# Patient Record
Sex: Male | Born: 1950 | Race: White | Hispanic: No | Marital: Married | Smoking: Never smoker
Health system: Southern US, Community
[De-identification: ages and names within clinical notes are randomized; demographics above are authoritative.]

## PROBLEM LIST (undated history)

## (undated) DIAGNOSIS — E785 Hyperlipidemia, unspecified: Secondary | ICD-10-CM

## (undated) DIAGNOSIS — K219 Gastro-esophageal reflux disease without esophagitis: Secondary | ICD-10-CM

## (undated) DIAGNOSIS — I10 Essential (primary) hypertension: Secondary | ICD-10-CM

## (undated) DIAGNOSIS — H269 Unspecified cataract: Secondary | ICD-10-CM

## (undated) DIAGNOSIS — R600 Localized edema: Secondary | ICD-10-CM

## (undated) DIAGNOSIS — M1990 Unspecified osteoarthritis, unspecified site: Secondary | ICD-10-CM

## (undated) DIAGNOSIS — G2581 Restless legs syndrome: Secondary | ICD-10-CM

## (undated) DIAGNOSIS — T7840XA Allergy, unspecified, initial encounter: Secondary | ICD-10-CM

## (undated) DIAGNOSIS — G4700 Insomnia, unspecified: Secondary | ICD-10-CM

## (undated) DIAGNOSIS — G4730 Sleep apnea, unspecified: Secondary | ICD-10-CM

## (undated) DIAGNOSIS — Z5189 Encounter for other specified aftercare: Secondary | ICD-10-CM

## (undated) HISTORY — PX: EXPLORATORY LAPAROTOMY: SUR591

## (undated) HISTORY — DX: Unspecified osteoarthritis, unspecified site: M19.90

## (undated) HISTORY — DX: Gastro-esophageal reflux disease without esophagitis: K21.9

## (undated) HISTORY — DX: Restless legs syndrome: G25.81

## (undated) HISTORY — PX: APPENDECTOMY: SHX54

## (undated) HISTORY — DX: Hyperlipidemia, unspecified: E78.5

## (undated) HISTORY — DX: Unspecified cataract: H26.9

## (undated) HISTORY — DX: Insomnia, unspecified: G47.00

## (undated) HISTORY — DX: Localized edema: R60.0

## (undated) HISTORY — DX: Essential (primary) hypertension: I10

## (undated) HISTORY — DX: Encounter for other specified aftercare: Z51.89

## (undated) HISTORY — DX: Allergy, unspecified, initial encounter: T78.40XA

---

## 1991-10-11 HISTORY — PX: BACK SURGERY: SHX140

## 1996-10-10 HISTORY — PX: KNEE ARTHROSCOPY: SHX127

## 2001-03-09 ENCOUNTER — Inpatient Hospital Stay (HOSPITAL_COMMUNITY): Admission: EM | Admit: 2001-03-09 | Discharge: 2001-03-13 | Payer: Self-pay | Admitting: *Deleted

## 2001-03-09 ENCOUNTER — Encounter: Payer: Self-pay | Admitting: Internal Medicine

## 2001-03-10 ENCOUNTER — Encounter: Payer: Self-pay | Admitting: General Surgery

## 2001-03-12 ENCOUNTER — Encounter: Payer: Self-pay | Admitting: General Surgery

## 2001-07-11 ENCOUNTER — Encounter: Payer: Self-pay | Admitting: Orthopedic Surgery

## 2001-07-18 ENCOUNTER — Observation Stay (HOSPITAL_COMMUNITY): Admission: RE | Admit: 2001-07-18 | Discharge: 2001-07-19 | Payer: Self-pay | Admitting: Orthopedic Surgery

## 2001-07-18 HISTORY — PX: OTHER SURGICAL HISTORY: SHX169

## 2004-10-26 ENCOUNTER — Ambulatory Visit: Payer: Self-pay | Admitting: Family Medicine

## 2004-11-10 ENCOUNTER — Ambulatory Visit: Payer: Self-pay | Admitting: Family Medicine

## 2004-11-18 ENCOUNTER — Ambulatory Visit: Payer: Self-pay | Admitting: Family Medicine

## 2004-11-29 ENCOUNTER — Ambulatory Visit: Payer: Self-pay | Admitting: Family Medicine

## 2004-12-15 ENCOUNTER — Ambulatory Visit: Payer: Self-pay

## 2005-03-22 ENCOUNTER — Ambulatory Visit: Payer: Self-pay | Admitting: Family Medicine

## 2005-06-30 ENCOUNTER — Ambulatory Visit: Payer: Self-pay | Admitting: Family Medicine

## 2005-10-04 ENCOUNTER — Ambulatory Visit: Payer: Self-pay | Admitting: Family Medicine

## 2005-10-12 ENCOUNTER — Ambulatory Visit: Payer: Self-pay | Admitting: Family Medicine

## 2008-03-10 HISTORY — PX: NM MYOVIEW LTD: HXRAD82

## 2008-03-17 ENCOUNTER — Ambulatory Visit: Payer: Self-pay | Admitting: Internal Medicine

## 2008-03-25 ENCOUNTER — Ambulatory Visit: Payer: Self-pay

## 2008-03-25 LAB — CONVERTED CEMR LAB
BUN: 17 mg/dL (ref 6–23)
CO2: 30 meq/L (ref 19–32)
Calcium: 9.9 mg/dL (ref 8.4–10.5)
Chloride: 103 meq/L (ref 96–112)
Creatinine, Ser: 1.1 mg/dL (ref 0.4–1.5)
GFR calc Af Amer: 88 mL/min
GFR calc non Af Amer: 73 mL/min
Glucose, Bld: 116 mg/dL — ABNORMAL HIGH (ref 70–99)
Potassium: 3.9 meq/L (ref 3.5–5.1)
Sodium: 142 meq/L (ref 135–145)

## 2008-04-01 ENCOUNTER — Ambulatory Visit: Payer: Self-pay | Admitting: Internal Medicine

## 2008-06-10 ENCOUNTER — Ambulatory Visit: Payer: Self-pay | Admitting: Internal Medicine

## 2008-09-23 ENCOUNTER — Ambulatory Visit: Payer: Self-pay | Admitting: Internal Medicine

## 2009-07-09 ENCOUNTER — Ambulatory Visit: Payer: Self-pay | Admitting: Internal Medicine

## 2009-07-09 DIAGNOSIS — I10 Essential (primary) hypertension: Secondary | ICD-10-CM | POA: Insufficient documentation

## 2009-07-09 DIAGNOSIS — R609 Edema, unspecified: Secondary | ICD-10-CM

## 2009-07-09 DIAGNOSIS — R5383 Other fatigue: Secondary | ICD-10-CM

## 2009-07-09 DIAGNOSIS — R5381 Other malaise: Secondary | ICD-10-CM

## 2009-11-05 ENCOUNTER — Telehealth: Payer: Self-pay | Admitting: Internal Medicine

## 2010-08-30 ENCOUNTER — Ambulatory Visit: Payer: Self-pay | Admitting: Internal Medicine

## 2010-09-06 ENCOUNTER — Telehealth: Payer: Self-pay | Admitting: Internal Medicine

## 2010-09-07 ENCOUNTER — Telehealth (INDEPENDENT_AMBULATORY_CARE_PROVIDER_SITE_OTHER): Payer: Self-pay | Admitting: *Deleted

## 2010-09-09 ENCOUNTER — Encounter: Payer: Self-pay | Admitting: Internal Medicine

## 2010-09-10 ENCOUNTER — Ambulatory Visit: Payer: Self-pay | Admitting: Internal Medicine

## 2010-09-21 ENCOUNTER — Ambulatory Visit: Payer: Self-pay | Admitting: Internal Medicine

## 2010-10-06 ENCOUNTER — Ambulatory Visit: Payer: Self-pay | Admitting: Internal Medicine

## 2010-10-10 HISTORY — PX: HERNIA REPAIR: SHX51

## 2010-10-15 ENCOUNTER — Ambulatory Visit: Admit: 2010-10-15 | Payer: Self-pay | Admitting: Internal Medicine

## 2010-10-28 ENCOUNTER — Telehealth: Payer: Self-pay | Admitting: Internal Medicine

## 2010-11-07 LAB — CONVERTED CEMR LAB
BUN: 24 mg/dL — ABNORMAL HIGH (ref 6–23)
CO2: 32 meq/L (ref 19–32)
Calcium: 8.7 mg/dL (ref 8.4–10.5)
Chloride: 103 meq/L (ref 96–112)
Creatinine, Ser: 1.5 mg/dL (ref 0.4–1.5)
GFR calc non Af Amer: 49.83 mL/min (ref >60)
Glucose, Bld: 96 mg/dL (ref 70–99)
Potassium: 3.7 meq/L (ref 3.5–5.1)
Sodium: 142 meq/L (ref 135–145)
TSH: 0.23 microintl units/mL — ABNORMAL LOW (ref 0.35–5.50)

## 2010-11-09 NOTE — Progress Notes (Signed)
Summary: Records Request  Faxed EKG to [facility] ([phone]) upon receiving ROI from patient. Debby Freiberg  September 07, 2010 3:29 PM   Appended Document: Records Request Faxed EKG again to [facility] ([phone]). {[email or web address]}  {DATETIMESTAMP()}

## 2010-11-09 NOTE — Progress Notes (Signed)
Summary: side effect from meds  NM  Medications Added AMLODIPINE BESYLATE 5 MG TABS (AMLODIPINE BESYLATE) Take one tablet by mouth daily       Phone Note Call from Patient Call back at Home Phone [phone] Call back at Work Phone [phone]   Caller: Patient Reason for Call: Talk to Nurse Summary of Call: c/o b/p 183/94 . tired all the time.  meds dosage was increase - TEKTURNA 300 MG TABS Take 1 tablet by mouth once a day Initial call taken by: Lorne Skeens,  September 06, 2010 10:54 AM  Follow-up for Phone Call        Pt. states on the last office visit 11/21/ 11 Dr. Gala Romney increased the dose of Tekturna from 150 mg to 300 mg once a day. Since then pt c/o of being tired, fatigue all the time. His B/P has been extremely high. Todays B/P is  178/99 to 183/94. Follow-up by: Ollen Gross, RN, BSN,  September 06, 2010 11:53 AM  Additional Follow-up for Phone Call Additional follow up Details #1::        Dr Riley Kill DOD recommends for pt. to rest today, and to see Dr. Gala Romney tomorrow. An appointment was made for pt. for tomorrow 09/07/10 at 11:00 Am. I let a phone message to pt. to call back. Ollen Gross, RN, BSN  September 06, 2010 12:25  PM Pt. called back. He states can't make the appointment for tomorrow because he has other engagement. The appointment was changed to 09/10/10 at 11:15 AM. Pt aware.  Additional Follow-up by: Ollen Gross, RN, BSN,  September 06, 2010 4:35 PM    Additional Follow-up for Phone Call Additional follow up Details #2::    Add amlodipine 5mg  daily.  Creatinine up slightly on last labs and TSH low. Please schedule f/u labs for this week including repeat BMET, TSH, free T4, Free T3, cortisol level and UA. Dolores Patty, MD, [facility]  September 07, 2010 12:58 PM  Left message to call back Meredith Staggers, RN  September 07, 2010 5:27 PM   spoke w/pt he is aware, rx sent into pharmacy, order for labs faxed to his pcp in [facility], RN   September 08, 2010 9:51 AM   New/Updated Medications: AMLODIPINE BESYLATE 5 MG TABS (AMLODIPINE BESYLATE) Take one tablet by mouth daily Prescriptions: AMLODIPINE BESYLATE 5 MG TABS (AMLODIPINE BESYLATE) Take one tablet by mouth daily  #30 x 6   Entered by:   Meredith Staggers, RN   Authorized by:   Dolores Patty, MD, [facility]   Signed by:   Meredith Staggers, RN on 09/08/2010   Method used:   [facility]        [facility] [id number] (retail)       [address]       [place], [place]  [address]       Ph: [phone] or [phone]       Fax: [phone]   RxID:   [id number]

## 2010-11-09 NOTE — Progress Notes (Signed)
Summary: REFILL   Phone Note Refill Request Message from:  Patient on November 05, 2009 3:43 PM  Refills Requested: Medication #1:  AMBIEN 10 MG TABS Take 1 tab by mouth at bedtime as needed. [facility] [address] [facility] [place] [phone]  Initial call taken by: Judie Grieve,  November 05, 2009 3:46 PM  Follow-up for Phone Call        left message  Hardin Negus, RMA  November 06, 2009 9:51 AM  Spoke with Pt told him to obtain refills from PCP. He understands and agrees to do this. Follow-up by: Hardin Negus, RMA,  November 06, 2009 2:34 PM

## 2010-11-09 NOTE — Assessment & Plan Note (Signed)
Summary: rov./gd  Medications Added TEKTURNA 300 MG TABS (ALISKIREN FUMARATE) Take 1 tablet by mouth once a day ASPIRIN 81 MG TBEC (ASPIRIN) Take one tablet by mouth daily PREVACID 30 MG CPDR (LANSOPRAZOLE) Take 1 tablet by mouth once a day FLEXERIL 5 MG TABS (CYCLOBENZAPRINE HCL) as needed      Allergies Added: NKDA  Visit Type:  Follow-up Primary Provider:  Dina Rich Md  CC:  no complaints.  History of Present Illness: Kristopher [name] is a delightful 60 year old male who works with [employer].  He has a history of hypertension, lower extremity edema, hyperlipidemia, and dyspnea.  He returns today for routine followup.   He had a Myoview back in June 2009, which showed an EF of 58% with no ischemia.  Overall, he is doing great.  He is remaining active with no chest pain or shortness of breath.    Taking BPs regularly. Recently SBPs have been elevated with avearge 135-155 occasional spike to 175. No symptoms from it. Compliant with meds and trying to watch salt.   Had sleep study at [place] a few years ago. Was told he had restless legs. He's not sure if he snores or not.   Current Medications (verified): 1)  Lipitor 40 Mg Tabs (Atorvastatin Calcium) .... Take One Tablet By Mouth Daily. 2)  Lisinopril-Hydrochlorothiazide 20-12.5 Mg Tabs (Lisinopril-Hydrochlorothiazide) .... Take One Tablet By Mouth Twice Daily. 3)  Carvedilol 25 Mg Tabs (Carvedilol) .... Take One Tablet By Mouth Twice A Day 4)  Tekturna 150 Mg Tabs (Aliskiren Fumarate) .... Take One Tablet By Mouth Daily 5)  Ropinirole Hcl 1 Mg Tabs (Ropinirole Hcl) .... Take One Tablet By Mouth Once Daily. 6)  Fish Oil   Oil (Fish Oil) .... 1200mg  Two Times A Day 7)  Tramadol-Acetaminophen 37.5-325 Mg Tabs (Tramadol-Acetaminophen) .... As Needed 8)  Aspirin 81 Mg Tbec (Aspirin) .... Take One Tablet By Mouth Daily 9)  Ambien 10 Mg Tabs (Zolpidem Tartrate) .... Take 1 Tab By Mouth At Bedtime As Needed 10)  Prevacid 30 Mg Cpdr  (Lansoprazole) .... Take 1 Tablet By Mouth Once A Day 11)  Flexeril 5 Mg Tabs (Cyclobenzaprine Hcl) .... As Needed  Allergies (verified): No Known Drug Allergies  Vital Signs:  Patient profile:   60 year old male Height:      73 inches Weight:      223 pounds BMI:     29.53 Pulse rate:   62 / minute BP sitting:   152 / 86  (left arm) Cuff size:   regular  Vitals Entered By: Hardin Negus, RMA (August 30, 2010 3:10 PM)  Physical Exam  General:  Well appearing. no resp difficulty HEENT: normal Neck: supple. no JVD. Carotids 2+ bilat; no bruits. No lymphadenopathy or thryomegaly appreciated. Cor: PMI nondisplaced. Regular rate & rhythm. No rubs, murmur. +s4 Lungs: clear Abdomen: soft, nontender, nondistended. No hepatosplenomegaly. No bruits or masses. Good bowel sounds. Extremities: no cyanosis, clubbing, rash, edema Neuro: alert & orientedx3, cranial nerves grossly intact. moves all 4 extremities w/o difficulty. affect pleasant    Impression & Recommendations:  Problem # 1:  HYPERTENSION, BENIGN (ICD-401.1) BP elevated. Will increase Tekturna to 300qd. May need another agent soon as well. Check BMET soon.   Other Orders: EKG w/ Interpretation (93000) TLB-BMP (Basic Metabolic Panel-BMET) (80048-METABOL) TLB-TSH (Thyroid Stimulating Hormone) (16109-UEA)  Patient Instructions: 1)  Labs today 2)  Increase Tekturna to 300mg  daily 3)  Your physician recommends that you return for lab work in: 2 weeks  at your primary care doctors office 4)  Follow up in 6 weeks Prescriptions: TEKTURNA 300 MG TABS (ALISKIREN FUMARATE) Take 1 tablet by mouth once a day  #30 x 6   Entered by:   Meredith Staggers, RN   Authorized by:   Dolores Patty, MD, [facility]   Signed by:   Meredith Staggers, RN on 08/30/2010   Method used:   [facility]        [facility]  [id number] (retail)       [address]       [place], [place]  [address]       Ph: [phone] or [phone]       Fax:  [phone]   RxID:   [id number]

## 2010-11-09 NOTE — Assessment & Plan Note (Signed)
Summary: high B/P 183/94/ tired fatigue/nm   Visit Type:  Follow-up Primary Labron Bloodgood:  Dina Rich Md   History of Present Illness: Kristopher [name] is a delightful 60 year old male who works with [employer].  He has a history of hypertension, lower extremity edema, hyperlipidemia, and dyspnea.  He returns today for routine followup.   He had a Myoview back in June 2009, which showed an EF of 58% with no ischemia.  Overall, he is doing great.  He is remaining active with no chest pain or shortness of breath.    We saw him about a week and a half ago for routine f/u and BP elevated. Initially increased Tekturna to 300qd but BP still up so added amlodipine 5mg  once daily. However, BPS still elevated systolics running 150-190. Mostly 180-190. No real changes in what he ahs been doing. Was very lethargic for awhile but now feels much better. No CP, palpitations, flushing, diaphoresis. Had thumb surgery this week without complication. Not using decongestants.   Labs yesterday UA normal CR 1.08.K 3.6 TSH 0.320 (nml range 0.45-4.5) T4 and T3 normal. Cortisol normal.    Current Medications (verified): 1)  Lipitor 20 Mg Tabs (Atorvastatin Calcium) .... Take One Tablet By Mouth Daily. 2)  Lipitor 20 Mg Tabs (Atorvastatin Calcium) .... Take One Tablet By Mouth Daily. 3)  Carvedilol 25 Mg Tabs (Carvedilol) .... Take One Tablet By Mouth Twice A Day 4)  Tekturna 300 Mg Tabs (Aliskiren Fumarate) .... Take 1 Tablet By Mouth Once A Day 5)  Ropinirole Hcl 1 Mg Tabs (Ropinirole Hcl) .... Take One Tablet By Mouth Once Daily. 6)  Fish Oil   Oil (Fish Oil) .... 1200mg  Once Daily 7)  Tramadol-Acetaminophen 37.5-325 Mg Tabs (Tramadol-Acetaminophen) .... As Needed 8)  Aspirin 81 Mg Tbec (Aspirin) .... Take One Tablet By Mouth Daily 9)  Ambien 10 Mg Tabs (Zolpidem Tartrate) .... Take 1 Tab By Mouth At Bedtime As Needed 10)  Prevacid 30 Mg Cpdr (Lansoprazole) .... Take 1 Tablet By Mouth Once A Day 11)  Flexeril 5 Mg  Tabs (Cyclobenzaprine Hcl) .... As Needed 12)  Amlodipine Besylate 5 Mg Tabs (Amlodipine Besylate) .... Take One Tablet By Mouth Daily  Allergies (verified): No Known Drug Allergies  Review of Systems       As per HPI and past medical history; otherwise all systems negative.   Vital Signs:  Patient profile:   60 year old male Height:      73 inches Weight:      227 pounds BMI:     30.06 Pulse rate:   76 / minute BP sitting:   158 / 98  (left arm)  Vitals Entered By: Laurance Flatten CMA (September 10, 2010 11:20 AM)  Physical Exam  General:  Well appearing. no resp difficulty HEENT: normal Neck: supple. no JVD. Carotids 2+ bilat; no bruits. No lymphadenopathy or thryomegaly appreciated. Cor: PMI nondisplaced. Regular rate & rhythm. No rubs, murmur. +s4 Lungs: clear Abdomen: soft, nontender, nondistended. No hepatosplenomegaly. No bruits or masses. Good bowel sounds. Extremities: no cyanosis, clubbing, rash, edema Neuro: alert & orientedx3, cranial nerves grossly intact. moves all 4 extremities w/o difficulty. affect pleasant    Impression & Recommendations:  Problem # 1:  HYPERTENSION, BENIGN (ICD-401.1) I am unclear why his BP has shot up unexpectedly. I have some concern over secondary causes of HTN (pheo, adrenal tumor, etc) but no symptoms consistent with this and lab work exxentially normal except for mildly decreased TSH with normal T3  and T4. For now will increase amlodipine to 10 mg daily and have him watch salt intake very closely. Continue home BP monitoring. IF Bp not improving will likely due ab CT to rule out adrenal tumor and check for renal artery stenosis.   Anticoagulation Management Assessment/Plan:            Patient Instructions: 1)  Your physician recommends that you schedule a follow-up appointment in: One and one half weeks 2)  Your physician has recommended you make the following change in your medication: Increase amlodipine to 10 mg by mouth daily.

## 2010-11-11 NOTE — Progress Notes (Signed)
Summary: pt bp high/poss prob with med  Phone Note Call from Patient   Caller: Patient [phone] Reason for Call: Talk to Nurse Summary of Call: pt calling re bp high and poss problem with med he's taking Initial call taken by: Glynda Jaeger,  October 28, 2010 10:38 AM  Follow-up for Phone Call        pt has bronchitis, BP dropped to 71/59 yest. saw pcp recommended pt stop Tekturna due to a study that showed increase risk of heart attacks while on both tekturna and lisinopril and to call us, now BP 166/80, he states prior to low reading BP had been doing ok, will discuss w/Dr Rozell Kettlewell and call pt back Meredith Staggers, RN  October 28, 2010 3:23 PM   pt following up on call from last week [phone] until 3p then after 330p [phone] Glynda Jaeger  November 02, 2010 2:47 PM  Additional Follow-up for Phone Call Additional follow up Details #1::        restart amlodipine 10qd Dolores Patty, MD, [facility]  November 02, 2010 5:32 PM  pt aware, rx sent in [place], RN  November 02, 2010 5:41 PM     New/Updated Medications: AMLODIPINE BESYLATE 10 MG TABS (AMLODIPINE BESYLATE) Take one tablet by mouth daily Prescriptions: AMLODIPINE BESYLATE 10 MG TABS (AMLODIPINE BESYLATE) Take one tablet by mouth daily  #30 x 6   Entered by:   Meredith Staggers, RN   Authorized by:   Dolores Patty, MD, [facility]   Signed by:   Meredith Staggers, RN on 11/02/2010   Method used:   [facility]        [facility]  [id number] (retail)       [address]       [place], [place]  [address]       Ph: [phone] or [phone]       Fax: [phone]   RxID:   [id number]

## 2010-11-11 NOTE — Assessment & Plan Note (Signed)
Summary: f/u appt @ 1:45/sl   Visit Type:  Follow-up Primary Provider:  Dina Rich Md  CC:  dizzy this AM / BP.  History of Present Illness: Kristopher [name] is a delightful 60 year old male who works with [employer].  He has a history of hypertension, lower extremity edema, hyperlipidemia, and dyspnea.  He returns today for routine followup.   He had a Myoview back in June 2009, which showed an EF of 58% with no ischemia.    For the past month or two we have been struggling with abrupt increase in his BP and have tirtated up his Tekturna and added amlodipine. Initially no real benefit but now his BP is dropping quickly and systolics running from 99-120. This am felt very weak and fatigued and had to have someone drive hmi home from work. No change in his regimen otherwise. No GI symptoms.   Recent labs reviewed with him and his wife: TSH 11/21: 0.23 Labs 12/2  UA normal CR 1.08.K 3.6 TSH 0.320 (nml range 0.45-4.5) T4 and T3 normal. Cortisol normal.  Current Medications (verified): 1)  Lipitor 20 Mg Tabs (Atorvastatin Calcium) .... Take One Tablet By Mouth Daily. 2)  Lisinopril 20 Mg Tabs (Lisinopril) .... Take One Tablet By Mouth Daily 3)  Carvedilol 25 Mg Tabs (Carvedilol) .... Take One Tablet By Mouth Twice A Day 4)  Tekturna 300 Mg Tabs (Aliskiren Fumarate) .... Take 1 Tablet By Mouth Once A Day 5)  Ropinirole Hcl 1 Mg Tabs (Ropinirole Hcl) .... Take One Tablet By Mouth Once Daily. 6)  Fish Oil   Oil (Fish Oil) .... 1200mg  Once Daily 7)  Tramadol-Acetaminophen 37.5-325 Mg Tabs (Tramadol-Acetaminophen) .... As Needed 8)  Aspirin 81 Mg Tbec (Aspirin) .... Take One Tablet By Mouth Daily 9)  Ambien 10 Mg Tabs (Zolpidem Tartrate) .... Take 1 Tab By Mouth At Bedtime As Needed 10)  Prevacid 30 Mg Cpdr (Lansoprazole) .... Take 1 Tablet By Mouth Once A Day 11)  Flexeril 5 Mg Tabs (Cyclobenzaprine Hcl) .... As Needed 12)  Amlodipine Besylate 10 Mg Tabs (Amlodipine Besylate) .... Take One  Tablet By Mouth Daily  Allergies (verified): No Known Drug Allergies  Past History:  Past Medical History: Last updated: 07/09/2009 HTN Lower extemity edema Restless legs Hyperlipidemia GERD Myoview June 2009  EF 58% normal perfusion  Review of Systems       As per HPI and past medical history; otherwise all systems negative.   Vital Signs:  Patient profile:   60 year old male Height:      73 inches Weight:      223 pounds BMI:     29.53 Pulse rate:   80 / minute BP sitting:   92 / 68  (left arm) BP standing:   84 / 65 Cuff size:   regular  Vitals Entered By: Hardin Negus, RMA (September 21, 2010 11:39 AM)  Physical Exam  General:  Fatigued appearing. no resp difficulty HEENT: normal Neck: supple. no JVD. Carotids 1+ bilat; no bruits. No lymphadenopathy or thryomegaly appreciated. Cor: PMI nondisplaced. Regular rate & rhythm. No rubs, murmur. +s4 Lungs: clear Abdomen: soft, nontender, nondistended. No hepatosplenomegaly. No bruits or masses. Good bowel sounds. Extremities: no cyanosis, clubbing, rash, edema Neuro: alert & orientedx3, cranial nerves grossly intact. moves all 4 extremities w/o difficulty. affect pleasant    Impression & Recommendations:  Problem # 1:  HYPERTENSION, BENIGN (ICD-401.1) BP now back down dramatically and now even with symptomatic hypotension. We will cut his Tekturna  back to 150 once daily and d/c amlodipine. Told him to drink lots of fluids today and liberalize salt intake. I remain unclear as to what caused his BP to spike for a couple of months - we discussed the possiblity of pain/stress from thumb injury/surgery but didn't correlate perfectly. His TSH is a bit low but coming back up and T3/T4 and cortisol are normal thus I don't think there is any obvious endocrine abnormality here but I left a message with Dr. Ardyth Harps to discuss. We will see him back in 2 weeks for f/u. Knows to call me if not feeling well.   Problem # 2:   HYPOTENSION As above.   Patient Instructions: 1)  Stop Amlodipine 2)  Decrease Tekturna to 150mg  daily (1/2 tab) 3)  Follow up in 2 weeks with labwork--Wed 12/28 at 4pm

## 2010-11-11 NOTE — Assessment & Plan Note (Signed)
Summary: f2w   Visit Type:  2 week follow up Primary Provider:  Dina Rich Md  CC:  no complaints.  History of Present Illness: Kristopher [name] is a delightful 60 year old male who works with [employer].  He has a history of hypertension, lower extremity edema, hyperlipidemia, and dyspnea.  He returns today for routine followup.   He had a Myoview back in June 2009, which showed an EF of 58% with no ischemia.    For the past month or two we have been struggling with abrupt increase in his BP and have tirtated up his Tekturna and added amlodipine. Initially no real benefit but then BP dropped quickly and became hypotensive. At last visit changed all his medications back to original dose.  No much better. BP leveling out. Checking every day and systolics range 101-160 but average in the 120s. Feels good. Back to normal.      Current Medications (verified): 1)  Lipitor 40 Mg Tabs (Atorvastatin Calcium) .... Take One Tablet By Mouth Daily. 2)  Lisinopril-Hydrochlorothiazide 20-12.5 Mg Tabs (Lisinopril-Hydrochlorothiazide) .... Take 1 Tablet, Two Times A Day 3)  Carvedilol 25 Mg Tabs (Carvedilol) .... Take One Tablet By Mouth Twice A Day 4)  Tekturna 300 Mg Tabs (Aliskiren Fumarate) .... Take 1/2 Tablet By Mouth Once A Day 5)  Ropinirole Hcl 1 Mg Tabs (Ropinirole Hcl) .... Take One Tablet By Mouth Once Daily. 6)  Fish Oil   Oil (Fish Oil) .... 1200mg  Once Daily 7)  Tramadol-Acetaminophen 37.5-325 Mg Tabs (Tramadol-Acetaminophen) .... As Needed 8)  Aspirin 81 Mg Tbec (Aspirin) .... Take One Tablet By Mouth Daily 9)  Ambien 10 Mg Tabs (Zolpidem Tartrate) .... Take 1 Tab By Mouth At Bedtime As Needed 10)  Prevacid 30 Mg Cpdr (Lansoprazole) .... Take 1 Tablet By Mouth Once A Day 11)  Flexeril 5 Mg Tabs (Cyclobenzaprine Hcl) .... As Needed  Allergies (verified): No Known Drug Allergies  Past History:  Past Medical History: Last updated: 07/09/2009 HTN Lower extemity edema Restless  legs Hyperlipidemia GERD Myoview June 2009  EF 58% normal perfusion  Review of Systems       As per HPI and past medical history; otherwise all systems negative.   Vital Signs:  Patient profile:   60 year old male Height:      73 inches Weight:      226.50 pounds BMI:     29.99 Pulse rate:   74 / minute BP sitting:   136 / 78  (right arm) Cuff size:   regular  Vitals Entered By: Caralee Ates CMA (October 06, 2010 2:36 PM)  Physical Exam  General:  well appearing. no resp difficulty HEENT: normal Neck: supple. no JVD. Carotids 1+ bilat; no bruits. No lymphadenopathy or thryomegaly appreciated. Cor: PMI nondisplaced. Regular rate & rhythm. No rubs, murmur. +s4 Lungs: clear Abdomen: soft, nontender, nondistended. No hepatosplenomegaly. No bruits or masses. Good bowel sounds. Extremities: no cyanosis, clubbing, rash, edema Neuro: alert & orientedx3, cranial nerves grossly intact. moves all 4 extremities w/o difficulty. affect pleasant    Impression & Recommendations:  Problem # 1:  HYPERTENSION, BENIGN (ICD-401.1) BP now back to baseline on baseline meds.  Unsure why his BP spiked but now back to normal. Will continue to follow.   Patient Instructions: 1)  Your physician recommends that you schedule a follow-up appointment in: 4 months 2)  Your physician recommends that you continue on your current medications as directed. Please refer to the Current Medication list given to  you today.

## 2011-02-22 NOTE — Assessment & Plan Note (Signed)
[facility] OFFICE NOTE   NAME:[name], [name]                      MRN:          [id number]  DATE:04/01/2008                            DOB:          1951/09/14    PRIMARY CARE PHYSICIAN:  Dr. Dina Rich in [place].   INTERVAL HISTORY:  Rod is a very pleasant, 60 year old male with a  history of hypertension, restless legs, and dyspnea.   We saw him for the first time about two and a half weeks ago for  hypertension and lower extremity edema.  I suspected the edema is most  likely due to his Norvasc.  We changed his medications from Exforge over  to lisinopril and HCTZ.  He also got a Myoview due to his history of  dyspnea.   He returns today for a routine followup.  His edema has resolved.  He  has lost almost 10 pounds.  However, he does feel quite fatigued.  He  has been following his blood pressure fairly regularly and seems to be  averaging systolics in the 140 range.  His lowest pressure recorded was  118 and his highest systolic pressure recorded was 166.  He does note  that he gets up about 3 times at night to go to the bathroom and  previously he use to be able to go right back to sleep, but now he takes  about an hour.  He does not have a problem with his urinary stream  despite his nocturia, but does sometimes have problems with initiating  urination.  He has had a previous sleep study which showed restless  legs, but no apnea.   CURRENT MEDICATIONS:  1. Tekturna 150 a day.  2. ReQuip 1 mg at bedtime.  3. Lipitor 20 a day.  4. Lasix 20 a day.  5. Carvedilol 25 b.i.d.  6. Aspirin 325 a day.  7. Lisinopril/HCTZ 20/12.5 b.i.d.  8. Potassium 20 daily.   PHYSICAL EXAMINATION:  GENERAL:  He is well appearing, in no acute  distress, ambulatory around the clinic without any respiratory  difficulty.  VITAL SIGNS:  Blood pressure initially 132/64, on my recheck was 124/80,  heart rate is 85, and  weight is 216, which is down from previous visit  when he was at 224.  HEENT:  Normal.  NECK:  Supple.  No JVD.  Carotids are 2+ bilaterally with no bruits.  There is no lymphadenopathy or thyromegaly.  HEART:  PMI is nondisplaced.  Regular rate and rhythm.  No murmurs,  rubs, or gallops.  LUNGS:  Clear.  ABDOMEN:  Soft, nontender, and nondistended.  There is no  hepatosplenomegaly.  No bruits.  No masses.  Good bowel sounds.  EXTREMITIES:  Warm with no cyanosis, clubbing, or edema.  Distal pulses  are 1+.  No rash.  NEUROLOGIC:  Alert and oriented x3.  Cranial nerves II through XII are  intact.  Moves all 4 extremities without difficulty.  Affect is  pleasant.   ASSESSMENT AND PLAN:  1. Hypertension.  Blood pressure seems relatively well-controlled,  although he does have some high readings at home.  I have asked him      to have his cuff calibrated with his primary care doctor's office,      to make sure he is getting accurate readings.  We will leave his      blood pressure where it is for now, especially given his fatigue.  2. Fatigue.  I am not sure of the etiology of this.  I do still wonder      if he might have sleep apnea.  His nocturia may also be playing a      role.  We will start him on Flomax 0.4 once a day to see if this      helps with this at all.  I have reminded him to limit how much      fluid he drinks in few hours prior to going to bed.  3. Lower extremity edema, as above.  I suspect this is related to the      Norvasc, as he is on HCTZ we can just stop the Lasix.   DISPOSITION:  We will see him back in 6-8 weeks for followup.  I did  suggest that he could decrease his aspirin down to 81 mg a day for  primary prevention purposes.     Bevelyn Buckles. Bensimhon, MD  Electronically Signed    DRB/MedQ  DD: 04/01/2008  DT: 04/02/2008  Job #: [id number]   cc:   Dina Rich
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
## 2011-02-22 NOTE — Assessment & Plan Note (Signed)
Kristopher [name]                            CARDIOLOGY OFFICE NOTE   NAME:Kristopher [name], Kristopher [name]                      MRN:          [id number]  DATE:06/10/2008                            DOB:          May 17, 1951    INTERVAL HISTORY:  Kristopher [name] is a very pleasant 60 year old male who works at  the [employer], has history of hypertension and dyspnea.   He returns for a routine followup.  We did do Myoview on him in June,  which showed an EF of 58%.  No ischemia.   Overall, he is doing very well.  He feels his blood pressure has been  much better controlled, average pressures at home have been running in  130s systolically.  He denies any chest pain or shortness of breath.  He  has been remaining fairly active with his workouts.  His lower extremity  edema has essentially resolved after stopping Norvasc although he does  get it once in a while.   CURRENT MEDICATIONS:  1. Tekturna 150 mg a day.  2. ReQuip 1 mg at bedtime.  3. Lipitor 20 a day.  4. Carvedilol 25 b.i.d.  5. Aspirin 325 a day.  6. Lisinopril.  7. HCTZ 20/12.5 b.i.d.  8. Potassium 20 a day.  9. Flomax 0.4 a day.   PHYSICAL EXAMINATION:  GENERAL:  He is well-appearing in no acute  distress.  Ambulates in the clinic without any respiratory difficulty.  VITAL SIGNS:  Blood pressure is 138/80 with manual recheck 127/80, heart  rate 56, weighs 222.  HEENT:  Normal.  NECK:  Supple.  No JVD.  Carotids are 2+ bilaterally without any bruits.  There is no lymphadenopathy or thyromegaly.  CARDIAC:  PMI is nondisplaced.  He has regular rate and rhythm with no  murmurs, rubs, or gallops.  LUNGS:  Clear.  ABDOMEN:  Soft, nontender, and nondistended.  No hepatosplenomegaly.  No  bruits and no masses.  Good bowel sounds.  EXTREMITIES:  Warm with no cyanosis, clubbing, or edema.  Distal pulses  are 1+ bilaterally.  No rash.  NEURO:  Alert and orient x3.  Cranial nerves II through XII are intact.  Moves  all 4 extremities without difficulty.  Affect is pleasant.   ASSESSMENT AND PLAN:  1. Hypertension much improved control, but still not yet at goal.  We      will add spironolactone 12.5 mg a day and check a BMET next week.  2. Lower extremity edema.  This has basically resolved with      discontinuation of Norvasc.  He has requested a prescription for      Lasix to use p.r.n.  I explained this is reasonable.  He just needs      to be careful, not to take it too often.   DISPOSITION:  We will see him back in 6 months for followup.     Bevelyn Buckles. Bensimhon, MD  Electronically Signed    DRB/MedQ  DD: 06/10/2008  DT: 06/11/2008  Job #: [id number]

## 2011-02-22 NOTE — Assessment & Plan Note (Signed)
[facility]                            CARDIOLOGY OFFICE NOTE   NAME:[name], [name]                      MRN:          [id number]  DATE:09/23/2008                            DOB:          September 28, 1951    PRIMARY CARE PHYSICIAN:  Dr. Dina Rich in [place].   INTERVAL HISTORY:  Kristopher [name] is a delightful 60 year old male who works with  [employer].  He has a history of hypertension, lower extremity  edema, hyperlipidemia, and dyspnea.  He returns today for routine  followup.   He had a Myoview back in June, which showed an EF of 58% with no  ischemia.  Overall, he is doing great.  He is remaining active with no  chest pain or shortness of breath.  His lower extremity edema has  resolved.  His blood pressure has been very well controlled.  He does  note that he has had night sweats for 3 nights in a row several weeks  ago, but this also resolved.   CURRENT MEDICATIONS:  1. Tekturna 150 a day.  2. ReQuip 1 mg at night.  3. Lipitor 20 a day.  4. Carvedilol 25 b.i.d.  5. Lisinopril hydrochlorothiazide 20/12.5 b.i.d.  6. Potassium 20 a day.  7. Aspirin 81 a day.  8. Flomax.   PHYSICAL EXAMINATION:  GENERAL:  He is well appearing in no acute  distress, ambulates around the clinic without respiratory difficulty.  VITAL SIGNS:  Blood pressure is 118/74, heart rate is 53, weight is 224.  HEENT:  Normal.  NECK:  Supple.  No JVD.  Carotids are 2+ bilaterally without any bruits.  There is no lymphadenopathy or thyromegaly.  CARDIAC:  PMI is nondisplaced.  Regular rate and rhythm.  No murmurs,  rubs, or gallops.  LUNGS:  Clear.  ABDOMEN:  Soft, nontender, nondistended.  No hepatosplenomegaly, no  bruits, no masses.  Good bowel sounds.  EXTREMITIES:  Warm with no  cyanosis, clubbing, or edema.  Distal pulses are 1+ bilaterally.  No  rash.  NEURO:  Alert and oriented x3.  Cranial nerves II through XII are  intact.  Moves all 4 extremities without  difficulty.  Affect is  pleasant.   EKG shows sinus bradycardia at a rate of 53, question of septal Q waves.  No ST-T wave abnormalities.   Lipids showed a total cholesterol of 198, HDL 32, triglycerides of 45.  LDL was not calculated due to triglycerides.  Glucose is 87.   ASSESSMENT AND PLAN:  1. Hypertension well controlled.  Continue current therapy.  2. Lower extremity edema.  This is resolved.  3. Hyperlipidemia with markedly elevated triglycerides.  This was a      nonfasting lipid profile.  We will repeat his fasting lipid profile      tomorrow.  In the interim, I will start him on 2 g of fish oil a      day.  Based on his numbers, we will decide between starting a      statin or fibrate.  I did tell him to continue his exercise program  and watch the amount of carbohydrates he was taking.  We will get      back to him with results of his followup lipid panel.   DISPOSITION:  We will see him back in clinic in several months for  followup.     Bevelyn Buckles. Bensimhon, MD  Electronically Signed    DRB/MedQ  DD: 09/23/2008  DT: 09/24/2008  Job #: [id number]   cc:   Dina Rich

## 2011-02-25 NOTE — Consult Note (Signed)
[facility]  Patient:    Kristopher [name], Kristopher [name]                      MRN: [id number] Proc. Date: 03/09/01 Adm. Date:  [id number] Attending:  Mervin Hack CC:         Hedwig Morton. Juanda Chance, M.D. [facility]  Anselm Pancoast. Zachery Dakins, M.D.   Consultation Report  REASON FOR CONSULT:  I was asked by Dr. Juanda Chance to see this 60 year old male with history of acute renal failure. The patient has a history of intra-abdominal surgeries as a child due to complications from traumatic injury. He has a history of intestinal obstruction in the past. He presents now with several-day history of nausea, vomiting, diarrhea, abdominal distention, and pain with a complaint of dizziness and weight loss of about seven pounds. He presented to [facility] emergency room where x-rays were consistent with small bowel obstruction and a questionable fistula in the region of the abdominal wall.  LABORATORY STUDIES:  Laboratory studies were remarkable for BUN of 61, creatinine 4.4, hemoglobin of 16.6 g. The patients baseline renal function is not available; however, he and his wife report that there is no history of renal insufficiency.  The patient feels better now with NG tube drainage and an intravenous fluids are being administered.  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  SOCIAL HISTORY:  He is married. He is employed at [employer]. Nonsmoker or consume alcoholic beverages.  CURRENT MEDICATIONS:  Atacand, Toprol, aspirin, multivitamins, Lipitor, and the patient was taking Celebrex but he stopped this approximately three  days prior to admission.  FAMILY HISTORY:  Remarkable for breast cancer and heart disease.  PHYSICAL EXAMINATION:  GENERAL:  He is a pleasant Caucasian male who does not appear acutely ill.  VITAL SIGNS:  Blood pressure is 109/74, heart rate 107 on admission.  HEENT:  Normocephalic, atraumatic. NG tube is present and draining.  NECK:  Neck veins are flat.  LUNGS:  Clear.  HEART:  Regular rate and rhythm. No pericardial friction rub.  ABDOMEN:  Multiple surgical scars, no exclusive areas of tenderness. Bowel sounds decreased.  EXTREMITIES:  Trace to no edema. Skin turgor is minimally decreased.  NEUROLOGIC:  He is oriented in all spheres. No gross focality.  ASSESSMENT:  Acute renal failure, etiology secondary to hypovolemia, secondary to nausea, vomiting, as well as diarrhea (dehydration). This was exacerbated by the presence of an ARB and a history of COX-2 ingestion several days prior to admission. I believe the patient has a high likelihood of full recovery from this acute renal failure.  RECOMMENDATIONS: 1. Maximize fluid replacement with intravenous hydration. 2. I would keep the patient off COX-2 and ARB medications as you are doing. 3. Evaluate kidney size by CT scan, that has already been ordered, to    evaluate his gastrointestinal problems. 4. Check CK to make sure that Lipitor has not caused an underlying    rhabdomyalysis. 5. I do believe that COX-2 inhibitors and ARBs can be restarted once    complete renal recovery has occurred and the patient is well hydrated, but    patient will be at risk to stay on these medicines if he  has recurrent    bouts of hypovolemia. 6. Check urinalysis. 7. If patient is no better by Sunday, I think he needs the full renal workup. DD:  03/09/01 TD:  03/09/01 Job: [id number]
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
## 2011-02-25 NOTE — Op Note (Signed)
[facility]  Patient:    Kristopher [name], Kristopher [name] Visit Number: [id number] MRN: [id number]          Service Type: SUR Location: 4W [id number] [id number] Attending Physician:  Skip Mayer Proc. Date: 07/18/01 Admit Date:  07/18/2001                             Operative Report  SURGEON:  Windy Fast A. Darrelyn Hillock, M.D.  ASSISTANTDruscilla Brownie. Underwood III, P.A.-C.  PREOPERATIVE DIAGNOSES: 1. Large tear of the rotator cuff tendon on the right. 2. Severe impingement syndrome, right shoulder.  POSTOPERATIVE DIAGNOSES: 1. Large tear of the rotator cuff tendon on the right. 2. Severe impingement syndrome, right shoulder.  OPERATIONS: 1. Partial acromionectomy and acromioplasty on the right. 2. Repair of the rotator cuff tendon tear on the right utilizing three    multitack sutures.  DESCRIPTION OF PROCEDURE:  Under general anesthesia, routine orthopedic prepping and draping of the right shoulder was carried out.  The patient had an intrascalene block prior to the surgery.  At this time, an incision was made over the anterior aspect of the right shoulder, bleeders identified and cauterized.  He had 1 g of IV Ancef preop.  Following this, I then separated the deltoid tendon by sharp dissection from the acromion.  I then split the deltoid muscle proximally.  The subdeltoid bursa was removed.  He had a large hole in the distal end of his rotator cuff as it inserts into the humerus. Following this, we also noted he had severe impingement syndrome.  When we brought the shoulder up into abduction, the acromion literally was embedded right in the torn tendon.  So at this time, we utilized the oscillating saw and the bur and did a partial acromionectomy and acromioplasty.  Once we had good freedom of his acromion, we then utilized the bur and burred down the lateral articular surface of the humerus and then thoroughly irrigated out the wound.  We then inserted three multitack  sutures in the proximal humerus and sutured the tendon down in place to the bleeding bone bed.  We thoroughly irrigated out the area.  I then utilized some bone wax up under the acromion and then reapproximated the deltoid tendon and the muscle in the usual fashion.  The subcu was closed with 0 Vicryl, the skin with metal staples. Sterile Neosporin dressing was applied, and he was placed in a shoulder immobilizer.  The patient left the operating room in satisfactory condition. Attending Physician:  Skip Mayer DD:  07/18/01 TD:  07/18/01 Job: [phone]
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
## 2011-03-21 ENCOUNTER — Encounter: Payer: Self-pay | Admitting: Internal Medicine

## 2011-03-22 ENCOUNTER — Encounter: Payer: Self-pay | Admitting: Internal Medicine

## 2011-05-10 ENCOUNTER — Encounter: Payer: Self-pay | Admitting: Internal Medicine

## 2011-05-10 ENCOUNTER — Ambulatory Visit (INDEPENDENT_AMBULATORY_CARE_PROVIDER_SITE_OTHER): Payer: BC Managed Care – PPO | Admitting: Internal Medicine

## 2011-05-10 VITALS — BP 118/72 | HR 64 | Ht 73.0 in | Wt 218.0 lb

## 2011-05-10 DIAGNOSIS — I10 Essential (primary) hypertension: Secondary | ICD-10-CM

## 2011-05-10 NOTE — Progress Notes (Signed)
PCP: [address]  HPI:  Kristopher [name] is a delightful 60 year old male who works with [employer].  He has a history of hypertension, lower extremity edema, hyperlipidemia, and dyspnea.  He returns today for routine followup.  He had a Myoview back in June 2009, which showed an EF of 58% with no ischemia.    Last year he struggled with an abrupt increase in his BP after hurting his arm and have tirtated up his Tekturna and added amlodipine. Initially no real benefit but then BP dropped quickly and became hypotensive. At last visit changed all his medications back to original dose.  Now much better. BP very well controlled. Feels good. Exercising several times per week. No CP or SOB.    ROS: All systems negative except as listed in HPI, PMH and Problem List.  Past Medical History  Diagnosis Date  . Hypertension   . Lower extremity edema   . Restless legs   . Hyperlipidemia   . GERD (gastroesophageal reflux disease)     Current Outpatient Prescriptions  Medication Sig Dispense Refill  . amLODipine (NORVASC) 10 MG tablet Take 10 mg by mouth daily.        Marland Kitchen aspirin 81 MG tablet Take 81 mg by mouth daily.        Marland Kitchen atorvastatin (LIPITOR) 40 MG tablet Take 40 mg by mouth daily.        . carvedilol (COREG) 25 MG tablet Take 25 mg by mouth 2 (two) times daily with a meal.        . cyclobenzaprine (FLEXERIL) 5 MG tablet Take 5 mg by mouth as needed.        . Fish Oil OIL Take 1,200 mg by mouth daily.        . lansoprazole (PREVACID) 30 MG capsule Take 30 mg by mouth daily.        Marland Kitchen lisinopril-hydrochlorothiazide (PRINZIDE,ZESTORETIC) 20-12.5 MG per tablet Take 1 tablet by mouth 2 (two) times daily.        Marland Kitchen rOPINIRole (REQUIP) 1 MG tablet Take 1 mg by mouth daily.        . traMADol-acetaminophen (ULTRACET) 37.5-325 MG per tablet Take 1 tablet by mouth as needed.        . zolpidem (AMBIEN) 10 MG tablet Take 10 mg by mouth at bedtime as needed.           PHYSICAL EXAM: Filed Vitals:   05/10/11 1145  BP: 118/72  Pulse: 64   General:  Well appearing. No resp difficulty HEENT: normal Neck: supple. JVP flat. Carotids 2+ bilaterally; no bruits. No lymphadenopathy or thryomegaly appreciated. Cor: PMI normal. Regular rate & rhythm. No rubs, gallops or murmurs. Lungs: clear Abdomen: soft, nontender, nondistended. No hepatosplenomegaly. No bruits or masses. Good bowel sounds. Extremities: no cyanosis, clubbing, rash, edema Neuro: alert & orientedx3, cranial nerves grossly intact. Moves all 4 extremities w/o difficulty. Affect pleasant.   ASSESSMENT & PLAN:

## 2011-05-10 NOTE — Assessment & Plan Note (Signed)
Blood pressure well controlled. Continue current regimen.  

## 2011-06-06 ENCOUNTER — Other Ambulatory Visit: Payer: Self-pay | Admitting: Internal Medicine

## 2011-07-07 ENCOUNTER — Other Ambulatory Visit: Payer: Self-pay | Admitting: Internal Medicine

## 2011-08-12 ENCOUNTER — Other Ambulatory Visit: Payer: Self-pay | Admitting: Internal Medicine

## 2012-01-03 ENCOUNTER — Other Ambulatory Visit: Payer: Self-pay | Admitting: Internal Medicine

## 2012-05-22 ENCOUNTER — Other Ambulatory Visit (HOSPITAL_COMMUNITY): Payer: Self-pay | Admitting: Internal Medicine

## 2012-05-22 ENCOUNTER — Other Ambulatory Visit: Payer: Self-pay | Admitting: Cardiology

## 2012-05-22 MED ORDER — CARVEDILOL 25 MG PO TABS: 25.0000 mg | ORAL_TABLET | Freq: Two times a day (BID) | ORAL | Status: DC

## 2012-05-25 ENCOUNTER — Ambulatory Visit (INDEPENDENT_AMBULATORY_CARE_PROVIDER_SITE_OTHER): Payer: BC Managed Care – PPO | Admitting: Cardiology

## 2012-05-25 ENCOUNTER — Encounter: Payer: Self-pay | Admitting: Cardiology

## 2012-05-25 VITALS — BP 150/100 | HR 62 | Ht 73.0 in | Wt 224.1 lb

## 2012-05-25 DIAGNOSIS — R609 Edema, unspecified: Secondary | ICD-10-CM

## 2012-05-25 DIAGNOSIS — R5381 Other malaise: Secondary | ICD-10-CM

## 2012-05-25 DIAGNOSIS — I10 Essential (primary) hypertension: Secondary | ICD-10-CM

## 2012-05-25 DIAGNOSIS — R5383 Other fatigue: Secondary | ICD-10-CM

## 2012-05-25 NOTE — Patient Instructions (Addendum)
Your physician has recommended that you have a sleep study. This test records several body functions during sleep, including: brain activity, eye movement, oxygen and carbon dioxide blood levels, heart rate and rhythm, breathing rate and rhythm, the flow of air through your mouth and nose, snoring, body muscle movements, and chest and belly movement.   Obtain 3 Blood Pressures and call Dr.Hochrein's nurse to report readings.    Your physician recommends that you schedule a follow-up appointment in: after sleep study.

## 2012-05-25 NOTE — Progress Notes (Signed)
HPI The patient was previously seen by Dr. Gala Romney.  He has had difficult to control hypertension. He presents for routine followup. He says his blood pressure fluctuates and that it is sometimes elevated as it is today but at other times ins 120/70.  He says that for the most part he feels well.  The patient denies any new symptoms such as chest discomfort, neck or arm discomfort. There has been no new shortness of breath, PND or orthopnea. There have been no reported palpitations, presyncope or syncope.  However, he does not sleep well.  He snores and often wakes up tired. He did have a sleep study years ago.  Apparently this was not abnormal.  No Known Allergies  Current Outpatient Prescriptions  Medication Sig Dispense Refill  . amLODipine (NORVASC) 10 MG tablet TAKE 1 TABLET DAILY.  30 tablet  6  . aspirin 81 MG tablet Take 81 mg by mouth daily.        Marland Kitchen atorvastatin (LIPITOR) 40 MG tablet TAKE 1 TABLET EVERY DAY AS DIRECTED  30 tablet  10  . carvedilol (COREG) 25 MG tablet Take 1 tablet (25 mg total) by mouth 2 (two) times daily with a meal.  60 tablet  0  . cyclobenzaprine (FLEXERIL) 5 MG tablet Take 5 mg by mouth as needed.        . Fish Oil OIL Take 1,200 mg by mouth daily.        . lansoprazole (PREVACID) 30 MG capsule Take 30 mg by mouth daily.        Marland Kitchen lisinopril-hydrochlorothiazide (PRINZIDE,ZESTORETIC) 20-12.5 MG per tablet TAKE 1 TABLET BY MOUTH TWICE A DAY  60 tablet  10  . rOPINIRole (REQUIP) 1 MG tablet Take 1 mg by mouth daily.        . traMADol-acetaminophen (ULTRACET) 37.5-325 MG per tablet Take 1 tablet by mouth as needed.        . zolpidem (AMBIEN) 10 MG tablet Take 10 mg by mouth at bedtime as needed.          Past Medical History  Diagnosis Date  . Hypertension   . Lower extremity edema   . Restless legs   . Hyperlipidemia   . GERD (gastroesophageal reflux disease)     Past Surgical History  Procedure Date  . Partial acrominonectomy and acromioplasty  07/18/2001  . Repair of the rotator cuff tendon tear 07/18/2001    utilizing three multitack sutures  . Nm myoview ltd 03/2008    EF 58% normal perfusion    ROS:  As stated in the HPI and negative for all other systems.  PHYSICAL EXAM BP 150/100  Pulse 62  Ht 6\' 1"  (1.854 m)  Wt 224 lb 1.9 oz (101.66 kg)  BMI 29.57 kg/m2 GENERAL:  Well appearing HEENT:  Pupils equal round and reactive, fundi not visualized, oral mucosa unremarkable NECK:  No jugular venous distention, waveform within normal limits, carotid upstroke brisk and symmetric, no bruits, no thyromegaly LYMPHATICS:  No cervical, inguinal adenopathy LUNGS:  Clear to auscultation bilaterally BACK:  No CVA tenderness CHEST:  Unremarkable HEART:  PMI not displaced or sustained,S1 and S2 within normal limits, no S3, no S4, no clicks, no rubs, no murmurs ABD:  Flat, positive bowel sounds normal in frequency in pitch, no bruits, no rebound, no guarding, no midline pulsatile mass, no hepatomegaly, no splenomegaly EXT:  2 plus pulses throughout, no edema, no cyanosis no clubbing SKIN:  No rashes no nodules NEURO:  Cranial  nerves II through XII grossly intact, motor grossly intact throughout [facility]:  Cognitively intact, oriented to person place and time  EKG:  Sinus rhythm, rate 62, axis within normal limits, intervals within normal limits, no acute ST-T wave changes.  ASSESSMENT AND PLAN  HTN -  I will ask him to keep his blood pressure three times per day for 7 days.  I will adjust meds further based on this.  Snoring - He has symptoms consistent with sleep apnea.  I will order a sleep study.  Fatigue - He would benefit from a sleep consult since he is using Ambien to sleep and energy drinks to wake up.

## 2012-06-04 ENCOUNTER — Telehealth: Payer: Self-pay | Admitting: Cardiology

## 2012-06-04 NOTE — Telephone Encounter (Signed)
BP readings, 8-17 am 160/96, lunch 141/78, pm 161/89, 8-18,  am  168/82, lunch 139/62 pm 128/64 8-19  146/86, lunch 148/87, pm 157/81 8-20 163/99, 139/85, pm 132/81, 8-21 am 119/57, lunch 134/82, pm 117/61, 8-22 am 136/81, lunch 139/85, pm 138/94  , 8-23 am 151/99, lunch 149/87, pm 137/88, pls call [phone]

## 2012-06-06 ENCOUNTER — Telehealth: Payer: Self-pay | Admitting: *Deleted

## 2012-06-06 DIAGNOSIS — I10 Essential (primary) hypertension: Secondary | ICD-10-CM

## 2012-06-06 MED ORDER — HYDROCHLOROTHIAZIDE 12.5 MG PO TABS: 12.5000 mg | ORAL_TABLET | Freq: Every day | ORAL | Status: DC

## 2012-06-06 NOTE — Telephone Encounter (Signed)
Pt aware will start medication as ordered and have blood work on 06/19/12 He will keep a BP diary  This isn't under phone calls so I cannot answer it in the usual way. I would like to add another 12.5 daily of HCTZ and repeat the BP diary in 1 wk for another wk and check a BMET in 10 days. ----- Message ----- From: Rocco Serene, RN Sent: 06/06/2012 9:53 AM To: Rollene Rotunda, MD

## 2012-06-17 ENCOUNTER — Ambulatory Visit (HOSPITAL_BASED_OUTPATIENT_CLINIC_OR_DEPARTMENT_OTHER): Payer: BC Managed Care – PPO | Attending: Cardiology

## 2012-06-17 VITALS — Ht 73.0 in | Wt 220.0 lb

## 2012-06-17 DIAGNOSIS — I10 Essential (primary) hypertension: Secondary | ICD-10-CM

## 2012-06-17 DIAGNOSIS — G4733 Obstructive sleep apnea (adult) (pediatric): Secondary | ICD-10-CM

## 2012-06-17 DIAGNOSIS — R5381 Other malaise: Secondary | ICD-10-CM

## 2012-06-17 DIAGNOSIS — R609 Edema, unspecified: Secondary | ICD-10-CM

## 2012-06-19 ENCOUNTER — Telehealth: Payer: Self-pay | Admitting: *Deleted

## 2012-06-19 ENCOUNTER — Ambulatory Visit (INDEPENDENT_AMBULATORY_CARE_PROVIDER_SITE_OTHER): Payer: BC Managed Care – PPO | Admitting: *Deleted

## 2012-06-19 ENCOUNTER — Other Ambulatory Visit: Payer: Self-pay | Admitting: *Deleted

## 2012-06-19 DIAGNOSIS — E876 Hypokalemia: Secondary | ICD-10-CM

## 2012-06-19 DIAGNOSIS — I10 Essential (primary) hypertension: Secondary | ICD-10-CM

## 2012-06-19 LAB — BASIC METABOLIC PANEL
Calcium: 9 mg/dL (ref 8.4–10.5)
Creatinine, Ser: 1.4 mg/dL (ref 0.4–1.5)
GFR: 55.38 mL/min — ABNORMAL LOW (ref >60.00)
Glucose, Bld: 116 mg/dL — ABNORMAL HIGH (ref 70–99)
Sodium: 137 mEq/L (ref 135–145)

## 2012-06-19 MED ORDER — POTASSIUM CHLORIDE ER 10 MEQ PO TBCR: 10.0000 meq | EXTENDED_RELEASE_TABLET | Freq: Every day | ORAL | Status: DC

## 2012-06-19 NOTE — Telephone Encounter (Signed)
Dr Antoine Poche reviewed and no changes are to be made at this time.

## 2012-06-19 NOTE — Telephone Encounter (Signed)
Pt came into office for blood work as ordered.  He brought his bp dairy as ordered as well: 111/66, 110/57, 105/66, 115/50, 118/65, 106/46, 108/43, 120/61, 138/80, 120/57, 137/78, 117/56, 153/87, 134/77 and 145/56 HR has been between 63 to 86.  He did not report any complaints.

## 2012-06-22 ENCOUNTER — Telehealth: Payer: Self-pay

## 2012-06-22 ENCOUNTER — Other Ambulatory Visit (HOSPITAL_COMMUNITY): Payer: Self-pay | Admitting: Internal Medicine

## 2012-06-22 MED ORDER — LISINOPRIL-HYDROCHLOROTHIAZIDE 20-12.5 MG PO TABS: 1.0000 | ORAL_TABLET | Freq: Two times a day (BID) | ORAL | Status: DC

## 2012-06-22 NOTE — Telephone Encounter (Signed)
..   Requested Prescriptions   Pending Prescriptions Disp Refills  . carvedilol (COREG) 25 MG tablet [Pharmacy Med Name: CARVEDILOL 25 MG TABLET] 60 tablet 1    Sig: TAKE 1 TABLET TWICE DAILY  Please keep appointment for further refills

## 2012-06-22 NOTE — Telephone Encounter (Signed)
..   Requested Prescriptions   Signed Prescriptions Disp Refills  . lisinopril-hydrochlorothiazide (PRINZIDE,ZESTORETIC) 20-12.5 MG per tablet 60 tablet 10    Sig: Take 1 tablet by mouth 2 (two) times daily.    Authorizing Provider: Rollene Rotunda    Ordering User: Christella Hartigan, Oluwatomisin Deman Judie Petit

## 2012-06-26 DIAGNOSIS — G4730 Sleep apnea, unspecified: Secondary | ICD-10-CM

## 2012-06-26 DIAGNOSIS — G4710 Hypersomnia, unspecified: Secondary | ICD-10-CM

## 2012-06-27 ENCOUNTER — Other Ambulatory Visit (INDEPENDENT_AMBULATORY_CARE_PROVIDER_SITE_OTHER): Payer: BC Managed Care – PPO

## 2012-06-27 DIAGNOSIS — E876 Hypokalemia: Secondary | ICD-10-CM

## 2012-06-27 LAB — BASIC METABOLIC PANEL
Chloride: 102 mEq/L (ref 96–112)
Creatinine, Ser: 1.3 mg/dL (ref 0.4–1.5)
GFR: 60.95 mL/min (ref >60.00)
Potassium: 3.4 mEq/L — ABNORMAL LOW (ref 3.5–5.1)

## 2012-06-27 NOTE — Procedures (Signed)
[name], Kristopher [name] NO.:  [id number]  MEDICAL RECORD NO.:  [id number]          PATIENT TYPE:  OUT  LOCATION:  SLEEP CENTER                 FACILITY:  [facility]  PHYSICIAN:  Barbaraann Share, MD,FCCPDATE OF BIRTH:  1949-11-21  DATE OF STUDY:  06/17/2012                           NOCTURNAL POLYSOMNOGRAM  REFERRING PHYSICIAN:  Rollene Rotunda, MD, [facility]  INDICATION FOR STUDY:  Hypersomnia with sleep apnea.  EPWORTH SLEEPINESS SCORE:  5.  MEDICATIONS:  SLEEP ARCHITECTURE:  The patient had a total sleep time of 398 minutes with no slow-wave sleep and only 22 minutes of REM.  Sleep onset latency was normal at 1 minute and REM onset was very prolonged at 357 minutes. Sleep efficiency was normal at 96%.  RESPIRATORY DATA:  The patient was found to have 42 apneas and 21 obstructive hypopneas, giving him an apnea-hypopnea index of 10 events per hour.  The events occurred all in the supine position, and there was loud snoring noted throughout.  OXYGEN DATA:  There was O2 desaturation as low as 74% with the patient's obstructive events.  CARDIAC DATA:  Occasional PAC and PVC noted but no clinically significant arrhythmias were seen.  MOVEMENT-PARASOMNIA:  The patient had no significant leg jerks or other abnormal behaviors noted.  IMPRESSIONS-RECOMMENDATIONS: 1. Mild obstructive sleep apnea/hypopnea syndrome with an AHI of 10     events per hour and O2 desaturation as low as 74%.  Treatment for     this degree of sleep apnea can include a trial of weight loss     alone, upper airway surgery, dental appliance, and also CPAP.     Clinical correlation is suggested. 2. Occasional PAC and PVC noted, but no clinically significant     arrhythmias were seen.     Barbaraann Share, MD,FCCP Diplomate, American Board of Sleep Medicine   [facility]  D:  06/26/2012 08:54:59  T:  06/27/2012 04:21:19  Job:  [id number]

## 2012-06-29 ENCOUNTER — Other Ambulatory Visit: Payer: Self-pay | Admitting: *Deleted

## 2012-06-29 ENCOUNTER — Telehealth: Payer: Self-pay | Admitting: Cardiology

## 2012-06-29 ENCOUNTER — Telehealth: Payer: Self-pay

## 2012-06-29 DIAGNOSIS — I10 Essential (primary) hypertension: Secondary | ICD-10-CM

## 2012-06-29 DIAGNOSIS — E876 Hypokalemia: Secondary | ICD-10-CM

## 2012-06-29 MED ORDER — POTASSIUM CHLORIDE ER 10 MEQ PO TBCR: 20.0000 meq | EXTENDED_RELEASE_TABLET | Freq: Every day | ORAL | Status: DC

## 2012-06-29 NOTE — Telephone Encounter (Signed)
.  lm to call office

## 2012-06-29 NOTE — Telephone Encounter (Signed)
New Problem:    Patient called returning your call.  Please call back. 

## 2012-07-05 ENCOUNTER — Other Ambulatory Visit: Payer: Self-pay | Admitting: *Deleted

## 2012-07-05 DIAGNOSIS — G4733 Obstructive sleep apnea (adult) (pediatric): Secondary | ICD-10-CM

## 2012-07-06 ENCOUNTER — Other Ambulatory Visit (INDEPENDENT_AMBULATORY_CARE_PROVIDER_SITE_OTHER): Payer: BC Managed Care – PPO

## 2012-07-06 ENCOUNTER — Telehealth: Payer: Self-pay | Admitting: Cardiology

## 2012-07-06 DIAGNOSIS — I10 Essential (primary) hypertension: Secondary | ICD-10-CM

## 2012-07-06 LAB — BASIC METABOLIC PANEL
GFR: 57.29 mL/min — ABNORMAL LOW (ref >60.00)
Glucose, Bld: 123 mg/dL — ABNORMAL HIGH (ref 70–99)
Potassium: 3.6 mEq/L (ref 3.5–5.1)
Sodium: 137 mEq/L (ref 135–145)

## 2012-07-06 NOTE — Telephone Encounter (Signed)
Message copied by Pricilla Holm on Fri Jul 06, 2012 11:00 AM ------      Message from: FLEMING, PAMELA J      Created: Thu Jul 05, 2012  5:59 PM      Regarding: referral to Dr Shelle Iron       Pt needs a referral to Dr Shelle Iron for abnormal sleep study  Thanks

## 2012-07-09 NOTE — Telephone Encounter (Signed)
Schedule to see Dr. Shelle Iron  10-21-130 @ 3:30 saf

## 2012-07-10 ENCOUNTER — Telehealth: Payer: Self-pay | Admitting: Cardiology

## 2012-07-10 DIAGNOSIS — E876 Hypokalemia: Secondary | ICD-10-CM

## 2012-07-10 NOTE — Telephone Encounter (Signed)
New Problem:    Patient called in needing a refill of his Klor-con m10 sent into his pharmacy.  Please call back if you have any questions.

## 2012-07-12 ENCOUNTER — Telehealth: Payer: Self-pay | Admitting: Cardiology

## 2012-07-12 MED ORDER — POTASSIUM CHLORIDE CRYS ER 20 MEQ PO TBCR: 20.0000 meq | EXTENDED_RELEASE_TABLET | Freq: Every day | ORAL | Status: DC

## 2012-07-12 NOTE — Telephone Encounter (Signed)
RX sent into pharmacy. Pt notified. 

## 2012-07-12 NOTE — Telephone Encounter (Signed)
Pt still unable to get refill

## 2012-07-19 ENCOUNTER — Other Ambulatory Visit: Payer: Self-pay | Admitting: *Deleted

## 2012-07-19 MED ORDER — ATORVASTATIN CALCIUM 40 MG PO TABS: 40.0000 mg | ORAL_TABLET | Freq: Every day | ORAL | Status: DC

## 2012-07-20 ENCOUNTER — Other Ambulatory Visit: Payer: Self-pay | Admitting: *Deleted

## 2012-07-20 MED ORDER — AMLODIPINE BESYLATE 10 MG PO TABS: 10.0000 mg | ORAL_TABLET | Freq: Every day | ORAL | Status: DC

## 2012-07-30 ENCOUNTER — Ambulatory Visit (INDEPENDENT_AMBULATORY_CARE_PROVIDER_SITE_OTHER): Payer: BC Managed Care – PPO | Admitting: Pulmonary Disease

## 2012-07-30 ENCOUNTER — Encounter: Payer: Self-pay | Admitting: Pulmonary Disease

## 2012-07-30 VITALS — BP 140/92 | HR 81 | Temp 98.6°F | Ht 73.0 in | Wt 227.0 lb

## 2012-07-30 DIAGNOSIS — G4733 Obstructive sleep apnea (adult) (pediatric): Secondary | ICD-10-CM

## 2012-07-30 NOTE — Patient Instructions (Addendum)
Will refer for consideration of a dental appliance. Work on weight reduction.

## 2012-07-30 NOTE — Assessment & Plan Note (Signed)
The patient has mild obstructive sleep apnea by his recent sleep study, and does have restless sleep and some degree of fatigue and sleepiness during the day.  Given the mild nature of his disease, this is really not a significant health risk for him.  The decision to treat this degree of sleep apnea should be based on its impact to his and his bed partner his quality of life.  I have outlined a conservative approach with a trial of weight loss alone, as well as a more aggressive approach with a dental appliance or CPAP.  He really does not have an upper airway abnormality to respond well to surgery.  After a long discussion with the patient about the advantages and disadvantages of CPAP and a dental appliance, the patient would like a referral to dental medicine for further evaluation and possible treatment.

## 2012-07-30 NOTE — Progress Notes (Signed)
  Subjective:    Patient ID: Kristopher [name], male    DOB: [birth year], 61 y.o.   MRN: [id number]  HPI The patient is a 61 year old male who I've been asked to see for management of obstructive sleep apnea.  He recently underwent a sleep study, where he was found to have mild obstructive sleep apnea with an AHI of 10 events per hour.  The patient has been noted to have snoring, as well as an abnormal breathing pattern during sleep.  He has frequent awakenings at night, and has not rested in the mornings upon arising.  He has a history of a restless leg syndrome, for which he is on a dopamine agonist.  His wife does not complain about kicking currently, and his recent sleep study showed no significant leg jerks.  He has no RLS symptoms at night.  The patient denies any alertness issues until the late afternoon, and he is describing fatigue much more than sleepiness.  He denies any issues in the evenings watching television or movies, and has no sleepiness with driving.  He states that his weight is up 10 pounds over the last 2 years, and his Epworth score today is only 2.  Sleep Questionnaire: What time do you typically go to bed?( Between what hours) 9-10 pm How long does it take you to fall asleep? 15 -60 minutes How many times during the night do you wake up? What time do you get out of bed to start your day? 0600 Do you drive or operate heavy machinery in your occupation? No How much has your weight changed (up or down) over the past two years? (In pounds) 10 lb (4.536 kg) Have you ever had a sleep study before? Yes If yes, location of study? [facility] If yes, date of study? 06-17-12 Do you currently use CPAP? No Do you wear oxygen at any time? No   Review of Systems  Constitutional: Negative for fever and unexpected weight change.  HENT: Negative for ear pain, nosebleeds, congestion, sore throat, rhinorrhea, sneezing, trouble swallowing, dental problem, postnasal drip and sinus pressure.   Eyes: Negative for  redness and itching.  Respiratory: Negative for cough, chest tightness, shortness of breath and wheezing.   Cardiovascular: Negative for palpitations and leg swelling.  Gastrointestinal: Negative for nausea and vomiting.  Genitourinary: Negative for dysuria.  Musculoskeletal: Negative for joint swelling.  Skin: Negative for rash.  Neurological: Negative for headaches.  Hematological: Does not bruise/bleed easily.  Psychiatric/Behavioral: Negative for dysphoric mood. The patient is not nervous/anxious.        Objective:   Physical Exam Constitutional:  Well developed, no acute distress  HENT:  Nares patent without discharge  Oropharynx without exudate, palate is normal, uvula is elongated.  Eyes:  Perrla, eomi, no scleral icterus  Neck:  No JVD, no TMG  Cardiovascular:  Normal rate, regular rhythm, no rubs or gallops.  No murmurs        Intact distal pulses  Pulmonary :  Normal breath sounds, no stridor or respiratory distress   No rales, rhonchi, or wheezing  Abdominal:  Soft, nondistended, bowel sounds present.  No tenderness noted.   Musculoskeletal:  No lower extremity edema noted.  Lymph Nodes:  No cervical lymphadenopathy noted  Skin:  No cyanosis noted  Neurologic:  Alert, appropriate, moves all 4 extremities without obvious deficit.         Assessment & Plan:

## 2012-08-23 ENCOUNTER — Other Ambulatory Visit (HOSPITAL_COMMUNITY): Payer: Self-pay | Admitting: Internal Medicine

## 2012-08-25 ENCOUNTER — Other Ambulatory Visit (HOSPITAL_COMMUNITY): Payer: Self-pay | Admitting: Internal Medicine

## 2012-09-05 ENCOUNTER — Ambulatory Visit: Payer: BC Managed Care – PPO | Admitting: Pulmonary Disease

## 2012-09-11 ENCOUNTER — Ambulatory Visit (INDEPENDENT_AMBULATORY_CARE_PROVIDER_SITE_OTHER): Payer: BC Managed Care – PPO | Admitting: Pulmonary Disease

## 2012-09-11 ENCOUNTER — Encounter: Payer: Self-pay | Admitting: Pulmonary Disease

## 2012-09-11 VITALS — BP 102/68 | HR 75 | Temp 97.5°F | Ht 73.0 in | Wt 232.6 lb

## 2012-09-11 DIAGNOSIS — G4733 Obstructive sleep apnea (adult) (pediatric): Secondary | ICD-10-CM

## 2012-09-11 NOTE — Patient Instructions (Addendum)
Will try on cpap.  Please call me if you are having issues with tolerance. Work on weight loss followup with me in 6 weeks.

## 2012-09-11 NOTE — Progress Notes (Signed)
  Subjective:    Patient ID: Kristopher [name], male    DOB: 1951/02/26, 61 y.o.   MRN: [id number]  HPI The patient comes in today for followup of his known obstructive sleep apnea.  He was turned down by insurance for a dental appliance, and is here to discuss other treatment options.  He continues to have issues with restless sleep and fatigue during the day, and wants to treat this more aggressively.   Review of Systems  Constitutional: Positive for fatigue. Negative for fever and unexpected weight change.  HENT: Negative for ear pain, nosebleeds, congestion, sore throat, rhinorrhea, sneezing, trouble swallowing, dental problem, postnasal drip and sinus pressure.        Cold x 1 week ago  Eyes: Negative for redness and itching.  Respiratory: Negative for cough, chest tightness, shortness of breath and wheezing.   Cardiovascular: Negative for palpitations and leg swelling.  Gastrointestinal: Negative for nausea and vomiting.  Genitourinary: Negative for dysuria.  Musculoskeletal: Positive for back pain and arthralgias. Negative for joint swelling.       Arthritis  Skin: Negative for rash.  Neurological: Negative for headaches.  Hematological: Does not bruise/bleed easily.  Psychiatric/Behavioral: Negative for dysphoric mood. The patient is not nervous/anxious.        Objective:   Physical Exam Overweight male in no acute distress Nose without purulence or discharge noted Neck without lymphadenopathy or thyromegaly Lower extremities without edema, cyanosis Alert and oriented, moves all 4 extremities.       Assessment & Plan:

## 2012-09-11 NOTE — Assessment & Plan Note (Signed)
The patient has known obstructive sleep apnea, and is definitely symptomatic at night and during the day.  He really does not have upper airway anatomy that is amenable to surgery, and he has been turned down by insurance for a dental appliance for unknown reasons.  His only other treatment option is a trial of weight loss alone versus CPAP.  He really feels this is impacting his quality of life and would like to treat more aggressively, and therefore will start on CPAP.

## 2012-10-10 HISTORY — PX: PELVIC FRACTURE SURGERY: SHX119

## 2012-10-23 ENCOUNTER — Ambulatory Visit (INDEPENDENT_AMBULATORY_CARE_PROVIDER_SITE_OTHER): Payer: BC Managed Care – PPO | Admitting: Pulmonary Disease

## 2012-10-23 ENCOUNTER — Encounter: Payer: Self-pay | Admitting: Pulmonary Disease

## 2012-10-23 VITALS — BP 138/90 | HR 78 | Temp 100.2°F | Ht 73.0 in | Wt 237.0 lb

## 2012-10-23 DIAGNOSIS — G4733 Obstructive sleep apnea (adult) (pediatric): Secondary | ICD-10-CM

## 2012-10-23 NOTE — Progress Notes (Signed)
  Subjective:    Patient ID: Kristopher [name], male    DOB: 02-Mar-1951, 62 y.o.   MRN: [id number]  HPI The patient comes in today for followup of his obstructive sleep apnea.  He is wearing CPAP compliantly, and overall has tolerated well with an improvement in his symptoms.  He has not had any mask fit issues or pressure issues.  He did catch a cold most recently, and this has made it difficult to wear because of nasal congestion.  He is working on a nasal hygiene regimen.  Overall, he feels the trial has been successful.   Review of Systems  Constitutional: Negative for fever and unexpected weight change.  HENT: Positive for congestion, rhinorrhea, sneezing, postnasal drip and sinus pressure. Negative for ear pain, nosebleeds, sore throat, trouble swallowing and dental problem.   Eyes: Negative for redness and itching.  Respiratory: Positive for cough. Negative for chest tightness, shortness of breath and wheezing.   Cardiovascular: Negative for palpitations and leg swelling.  Gastrointestinal: Negative for nausea and vomiting.  Genitourinary: Negative for dysuria.  Musculoskeletal: Negative for joint swelling.  Skin: Negative for rash.  Neurological: Negative for headaches.  Hematological: Does not bruise/bleed easily.  Psychiatric/Behavioral: Negative for dysphoric mood. The patient is not nervous/anxious.        Objective:   Physical Exam Well-developed male in no acute distress Nose without purulence or discharge noted No skin breakdown or pressure necrosis from the CPAP mask Neck without lymphadenopathy or thyromegaly Lower extremities without edema or cyanosis alert, doesn't appear to be sleepy, moves all 4 extremities.       Assessment & Plan:

## 2012-10-23 NOTE — Patient Instructions (Addendum)
Continue with cpap, and call if having tolerance issues that are persisting. Will optimize pressure on auto setting in your device, and call you with results. Work on weight loss. followup with me in 6mos.

## 2012-10-23 NOTE — Assessment & Plan Note (Addendum)
The patient is tolerating CPAP well overall, and feels that it has helped his sleep and daytime alertness.  His wife has not heard breakthrough snoring.  At this point, we need to optimize his pressure, and will do this on the auto device.  I have asked him to keep up with supplies and mask changes, and to work on weight loss.

## 2012-11-25 ENCOUNTER — Other Ambulatory Visit (HOSPITAL_COMMUNITY): Payer: Self-pay | Admitting: Cardiology

## 2013-02-23 ENCOUNTER — Other Ambulatory Visit: Payer: Self-pay | Admitting: Cardiology

## 2013-03-06 ENCOUNTER — Other Ambulatory Visit: Payer: Self-pay | Admitting: Cardiology

## 2013-03-07 NOTE — Telephone Encounter (Signed)
..   Requested Prescriptions   Pending Prescriptions Disp Refills  . KLOR-CON M20 20 MEQ tablet [Pharmacy Med Name: KLOR-CON M20 TABLET] 30 tablet 1    Sig: TAKE 1 TABLET EVERY DAY

## 2013-04-22 ENCOUNTER — Ambulatory Visit: Payer: BC Managed Care – PPO | Admitting: Pulmonary Disease

## 2013-05-14 ENCOUNTER — Other Ambulatory Visit: Payer: Self-pay | Admitting: *Deleted

## 2013-05-14 MED ORDER — POTASSIUM CHLORIDE CRYS ER 20 MEQ PO TBCR: EXTENDED_RELEASE_TABLET | ORAL | Status: DC

## 2013-05-26 ENCOUNTER — Encounter (HOSPITAL_COMMUNITY): Payer: Self-pay | Admitting: Emergency Medicine

## 2013-05-26 ENCOUNTER — Emergency Department (HOSPITAL_COMMUNITY)
Admission: EM | Admit: 2013-05-26 | Discharge: 2013-05-27 | Disposition: A | Discharge status: Home / Self Care | Payer: BC Managed Care – PPO | Attending: Emergency Medicine | Admitting: Emergency Medicine

## 2013-05-26 DIAGNOSIS — Z8719 Personal history of other diseases of the digestive system: Secondary | ICD-10-CM | POA: Insufficient documentation

## 2013-05-26 DIAGNOSIS — F1021 Alcohol dependence, in remission: Secondary | ICD-10-CM | POA: Insufficient documentation

## 2013-05-26 DIAGNOSIS — I10 Essential (primary) hypertension: Secondary | ICD-10-CM | POA: Insufficient documentation

## 2013-05-26 DIAGNOSIS — R110 Nausea: Secondary | ICD-10-CM | POA: Insufficient documentation

## 2013-05-26 DIAGNOSIS — Z8639 Personal history of other endocrine, nutritional and metabolic disease: Secondary | ICD-10-CM | POA: Insufficient documentation

## 2013-05-26 DIAGNOSIS — F10239 Alcohol dependence with withdrawal, unspecified: Secondary | ICD-10-CM

## 2013-05-26 DIAGNOSIS — Z7982 Long term (current) use of aspirin: Secondary | ICD-10-CM | POA: Insufficient documentation

## 2013-05-26 DIAGNOSIS — F1020 Alcohol dependence, uncomplicated: Secondary | ICD-10-CM | POA: Insufficient documentation

## 2013-05-26 DIAGNOSIS — F10230 Alcohol dependence with withdrawal, uncomplicated: Secondary | ICD-10-CM | POA: Diagnosis present

## 2013-05-26 DIAGNOSIS — Z79899 Other long term (current) drug therapy: Secondary | ICD-10-CM | POA: Insufficient documentation

## 2013-05-26 DIAGNOSIS — R61 Generalized hyperhidrosis: Secondary | ICD-10-CM | POA: Insufficient documentation

## 2013-05-26 DIAGNOSIS — Z862 Personal history of diseases of the blood and blood-forming organs and certain disorders involving the immune mechanism: Secondary | ICD-10-CM | POA: Insufficient documentation

## 2013-05-26 DIAGNOSIS — G2581 Restless legs syndrome: Secondary | ICD-10-CM | POA: Insufficient documentation

## 2013-05-26 DIAGNOSIS — F411 Generalized anxiety disorder: Secondary | ICD-10-CM | POA: Insufficient documentation

## 2013-05-26 LAB — COMPREHENSIVE METABOLIC PANEL WITH GFR
ALT: 146 U/L — ABNORMAL HIGH (ref 0–53)
AST: 147 U/L — ABNORMAL HIGH (ref 0–37)
Albumin: 3.4 g/dL — ABNORMAL LOW (ref 3.5–5.2)
Alkaline Phosphatase: 198 U/L — ABNORMAL HIGH (ref 39–117)
BUN: 28 mg/dL — ABNORMAL HIGH (ref 6–23)
CO2: 26 meq/L (ref 19–32)
Calcium: 9.1 mg/dL (ref 8.4–10.5)
Chloride: 93 meq/L — ABNORMAL LOW (ref 96–112)
Creatinine, Ser: 1.66 mg/dL — ABNORMAL HIGH (ref 0.50–1.35)
GFR calc Af Amer: 49 mL/min — ABNORMAL LOW
GFR calc non Af Amer: 42 mL/min — ABNORMAL LOW
Glucose, Bld: 150 mg/dL — ABNORMAL HIGH (ref 70–99)
Potassium: 2.7 meq/L — CL (ref 3.5–5.1)
Sodium: 133 meq/L — ABNORMAL LOW (ref 135–145)
Total Bilirubin: 1.6 mg/dL — ABNORMAL HIGH (ref 0.3–1.2)
Total Protein: 6.8 g/dL (ref 6.0–8.3)

## 2013-05-26 LAB — RAPID URINE DRUG SCREEN, HOSP PERFORMED
Amphetamines: NOT DETECTED
Benzodiazepines: NOT DETECTED
Cocaine: NOT DETECTED
Opiates: NOT DETECTED
Tetrahydrocannabinol: NOT DETECTED

## 2013-05-26 LAB — ETHANOL: Alcohol, Ethyl (B): <11 mg/dL (ref 0–11)

## 2013-05-26 LAB — CBC
HCT: 42.7 % (ref 39.0–52.0)
Hemoglobin: 15 g/dL (ref 13.0–17.0)
RDW: 13.3 % (ref 11.5–15.5)
WBC: 6.4 10*3/uL (ref 4.0–10.5)

## 2013-05-26 MED ORDER — OXYMETAZOLINE HCL 0.05 % NA SOLN: 1.0000 | Freq: Two times a day (BID) | NASAL | Status: DC | PRN

## 2013-05-26 MED ORDER — ALUM & MAG HYDROXIDE-SIMETH 200-200-20 MG/5ML PO SUSP: 30.0000 mL | ORAL | Status: DC | PRN

## 2013-05-26 MED ORDER — CHLORDIAZEPOXIDE HCL 25 MG PO CAPS: 25.0000 mg | ORAL_CAPSULE | ORAL | Status: DC

## 2013-05-26 MED ORDER — MENTHOL 3 MG MT LOZG
1.0000 | LOZENGE | OROMUCOSAL | Status: DC | PRN
  Filled 2013-05-26: qty 9

## 2013-05-26 MED ORDER — VITAMIN B-1 100 MG PO TABS
100.0000 mg | ORAL_TABLET | Freq: Every day | ORAL | Status: DC
  Administered 2013-05-26 – 2013-05-27 (×2): 100 mg via ORAL
  Filled 2013-05-26 (×2): qty 1

## 2013-05-26 MED ORDER — CARVEDILOL 25 MG PO TABS
25.0000 mg | ORAL_TABLET | Freq: Two times a day (BID) | ORAL | Status: DC
  Administered 2013-05-26 – 2013-05-27 (×2): 25 mg via ORAL
  Filled 2013-05-26 (×5): qty 1

## 2013-05-26 MED ORDER — CHLORDIAZEPOXIDE HCL 25 MG PO CAPS
25.0000 mg | ORAL_CAPSULE | Freq: Four times a day (QID) | ORAL | Status: DC
  Administered 2013-05-26 – 2013-05-27 (×4): 25 mg via ORAL
  Filled 2013-05-26 (×3): qty 1

## 2013-05-26 MED ORDER — ACETAMINOPHEN 325 MG PO TABS: 650.0000 mg | ORAL_TABLET | ORAL | Status: DC | PRN

## 2013-05-26 MED ORDER — THIAMINE HCL 100 MG/ML IJ SOLN: 100.0000 mg | Freq: Every day | INTRAMUSCULAR | Status: DC

## 2013-05-26 MED ORDER — ASPIRIN EC 81 MG PO TBEC
81.0000 mg | DELAYED_RELEASE_TABLET | Freq: Every day | ORAL | Status: DC
  Administered 2013-05-26 – 2013-05-27 (×2): 81 mg via ORAL
  Filled 2013-05-26 (×2): qty 1

## 2013-05-26 MED ORDER — LORAZEPAM 1 MG PO TABS
1.0000 mg | ORAL_TABLET | Freq: Once | ORAL | Status: AC
  Administered 2013-05-26: 1 mg via ORAL
  Filled 2013-05-26: qty 1

## 2013-05-26 MED ORDER — CHLORDIAZEPOXIDE HCL 25 MG PO CAPS: 25.0000 mg | ORAL_CAPSULE | Freq: Three times a day (TID) | ORAL | Status: DC

## 2013-05-26 MED ORDER — ADULT MULTIVITAMIN W/MINERALS CH
1.0000 | ORAL_TABLET | Freq: Every day | ORAL | Status: DC
  Administered 2013-05-26 – 2013-05-27 (×2): 1 via ORAL
  Filled 2013-05-26 (×2): qty 1

## 2013-05-26 MED ORDER — POTASSIUM CHLORIDE CRYS ER 20 MEQ PO TBCR
20.0000 meq | EXTENDED_RELEASE_TABLET | Freq: Every day | ORAL | Status: DC
  Filled 2013-05-26: qty 3
  Filled 2013-05-26: qty 1

## 2013-05-26 MED ORDER — LORAZEPAM 1 MG PO TABS
0.0000 mg | ORAL_TABLET | Freq: Four times a day (QID) | ORAL | Status: DC
  Filled 2013-05-26: qty 2

## 2013-05-26 MED ORDER — HYDROCHLOROTHIAZIDE 12.5 MG PO CAPS
12.5000 mg | ORAL_CAPSULE | Freq: Every day | ORAL | Status: DC
  Administered 2013-05-27: 12.5 mg via ORAL
  Filled 2013-05-26: qty 1

## 2013-05-26 MED ORDER — CHLORDIAZEPOXIDE HCL 25 MG PO CAPS
25.0000 mg | ORAL_CAPSULE | Freq: Once | ORAL | Status: AC
  Administered 2013-05-26: 25 mg via ORAL
  Filled 2013-05-26: qty 1

## 2013-05-26 MED ORDER — AMLODIPINE BESYLATE 10 MG PO TABS
10.0000 mg | ORAL_TABLET | Freq: Every day | ORAL | Status: DC
  Administered 2013-05-27: 10 mg via ORAL
  Filled 2013-05-26: qty 1

## 2013-05-26 MED ORDER — NICOTINE 21 MG/24HR TD PT24: 21.0000 mg | MEDICATED_PATCH | Freq: Every day | TRANSDERMAL | Status: DC

## 2013-05-26 MED ORDER — ESCITALOPRAM OXALATE 10 MG PO TABS
10.0000 mg | ORAL_TABLET | Freq: Every evening | ORAL | Status: DC
  Administered 2013-05-26: 10 mg via ORAL
  Filled 2013-05-26: qty 1

## 2013-05-26 MED ORDER — LOPERAMIDE HCL 2 MG PO CAPS: 2.0000 mg | ORAL_CAPSULE | ORAL | Status: DC | PRN

## 2013-05-26 MED ORDER — LORAZEPAM 1 MG PO TABS: 0.0000 mg | ORAL_TABLET | Freq: Two times a day (BID) | ORAL | Status: DC

## 2013-05-26 MED ORDER — HYDROXYZINE HCL 25 MG PO TABS
25.0000 mg | ORAL_TABLET | Freq: Four times a day (QID) | ORAL | Status: DC | PRN
  Administered 2013-05-26 – 2013-05-27 (×2): 25 mg via ORAL
  Filled 2013-05-26 (×2): qty 1

## 2013-05-26 MED ORDER — ZOLPIDEM TARTRATE 5 MG PO TABS
5.0000 mg | ORAL_TABLET | Freq: Every evening | ORAL | Status: DC | PRN
  Administered 2013-05-26: 5 mg via ORAL
  Filled 2013-05-26: qty 1

## 2013-05-26 MED ORDER — HYDROCHLOROTHIAZIDE 12.5 MG PO CAPS
12.5000 mg | ORAL_CAPSULE | Freq: Every day | ORAL | Status: DC
  Filled 2013-05-26: qty 1

## 2013-05-26 MED ORDER — IBUPROFEN 200 MG PO TABS: 600.0000 mg | ORAL_TABLET | Freq: Three times a day (TID) | ORAL | Status: DC | PRN

## 2013-05-26 MED ORDER — LISINOPRIL 20 MG PO TABS
20.0000 mg | ORAL_TABLET | Freq: Every day | ORAL | Status: DC
  Administered 2013-05-27: 20 mg via ORAL
  Filled 2013-05-26: qty 1

## 2013-05-26 MED ORDER — THIAMINE HCL 100 MG/ML IJ SOLN: 100.0000 mg | Freq: Once | INTRAMUSCULAR | Status: DC

## 2013-05-26 MED ORDER — ONDANSETRON HCL 4 MG PO TABS: 4.0000 mg | ORAL_TABLET | Freq: Three times a day (TID) | ORAL | Status: DC | PRN

## 2013-05-26 MED ORDER — ONDANSETRON 4 MG PO TBDP: 4.0000 mg | ORAL_TABLET | Freq: Four times a day (QID) | ORAL | Status: DC | PRN

## 2013-05-26 MED ORDER — LORAZEPAM 1 MG PO TABS: 1.0000 mg | ORAL_TABLET | Freq: Three times a day (TID) | ORAL | Status: DC | PRN

## 2013-05-26 MED ORDER — POTASSIUM CHLORIDE CRYS ER 20 MEQ PO TBCR
60.0000 meq | EXTENDED_RELEASE_TABLET | Freq: Once | ORAL | Status: AC
  Administered 2013-05-26: 60 meq via ORAL

## 2013-05-26 MED ORDER — CHLORDIAZEPOXIDE HCL 25 MG PO CAPS
25.0000 mg | ORAL_CAPSULE | Freq: Four times a day (QID) | ORAL | Status: DC | PRN
  Administered 2013-05-27: 25 mg via ORAL
  Filled 2013-05-26 (×2): qty 1

## 2013-05-26 MED ORDER — LISINOPRIL-HYDROCHLOROTHIAZIDE 20-12.5 MG PO TABS: 1.0000 | ORAL_TABLET | Freq: Two times a day (BID) | ORAL | Status: DC

## 2013-05-26 MED ORDER — ROPINIROLE HCL 1 MG PO TABS
1.0000 mg | ORAL_TABLET | Freq: Every day | ORAL | Status: DC
  Administered 2013-05-26: 1 mg via ORAL
  Filled 2013-05-26 (×2): qty 1

## 2013-05-26 MED ORDER — CHLORDIAZEPOXIDE HCL 25 MG PO CAPS: 25.0000 mg | ORAL_CAPSULE | Freq: Every day | ORAL | Status: DC

## 2013-05-26 MED ORDER — VITAMIN B-1 100 MG PO TABS: 100.0000 mg | ORAL_TABLET | Freq: Every day | ORAL | Status: DC

## 2013-05-26 NOTE — ED Provider Notes (Signed)
CSN: [id number]     Arrival date & time 05/26/13  4098 History     First MD Initiated Contact with Patient 05/26/13 1002     Chief Complaint  Patient presents with  . Drug / Alcohol Assessment   (Consider location/radiation/quality/duration/timing/severity/associated sxs/prior Treatment) The history is provided by the patient and medical records.   Presents to the ED for alcohol detox.  Pt states for the past several years he has drank EtOH on a daily basis, anywhere from 0-4 [employer] Hard Lemonades, no liquor.  Last drink 2 days ago.  Pt states he feels like he needs to drink in order to function normally.  Denies any illicit drug use.  Pt states he has detoxed once before, approx 20 years ago.  No hx of DTs.  Pt states the past 2 night he was woken up sweaty and nauseated but no vomiting.  Notes he does feel somewhat anxious and "shakey" at present.  No chest pain, SOB, palpitations, abdominal pain, diarrhea, fevers, sweats, or chills.  Denies SI/HI/AVH.  Past Medical History  Diagnosis Date  . Hypertension   . Lower extremity edema   . Restless legs   . Hyperlipidemia   . GERD (gastroesophageal reflux disease)    Past Surgical History  Procedure Laterality Date  . Partial acrominonectomy and acromioplasty  07/18/2001  . Repair of the rotator cuff tendon tear  07/18/2001    utilizing three multitack sutures  . Nm myoview ltd  03/2008    EF 58% normal perfusion   Family History  Problem Relation Age of Onset  . Cancer Mother     breast cancer  . Breast cancer Mother   . Heart disease Father    History  Substance Use Topics  . Smoking status: Never Smoker   . Smokeless tobacco: Not on file  . Alcohol Use: Yes    Review of Systems  Psychiatric/Behavioral:       Detox  All other systems reviewed and are negative.    Allergies  Review of patient's allergies indicates no known allergies.  Home Medications   Current Outpatient Rx  Name  Route  Sig  Dispense  Refill   . amLODipine (NORVASC) 10 MG tablet   Oral   Take 10 mg by mouth daily.         Marland Kitchen aspirin 81 MG tablet   Oral   Take 81 mg by mouth daily.           . carvedilol (COREG) 25 MG tablet      TAKE 1 TABLET TWICE DAILY   60 tablet   2   . escitalopram (LEXAPRO) 10 MG tablet   Oral   Take 10 mg by mouth every evening.         . hydrochlorothiazide (MICROZIDE) 12.5 MG capsule   Oral   Take 12.5 mg by mouth daily.         Marland Kitchen lisinopril-hydrochlorothiazide (PRINZIDE,ZESTORETIC) 20-12.5 MG per tablet   Oral   Take 1 tablet by mouth 2 (two) times daily.         . Oxymetazoline HCl ([employer] NASAL SPRAY NA)   Nasal   Place 1-2 Squirts into the nose daily. AFRIN         . potassium chloride SA (KLOR-CON M20) 20 MEQ tablet      TAKE 1 TABLET EVERY DAY   30 tablet   1     .Marland KitchenPatient needs to contact office to schedule  App ...   Marland Kitchen  Pseudoephedrine HCl (DECONGESTANT PO)   Oral   Take 2 tablets by mouth 2 (two) times daily. Decongestant for patients with high B/P         . rOPINIRole (REQUIP) 1 MG tablet   Oral   Take 1 mg by mouth at bedtime.           Pulse 92  Temp(Src) 98.9 F (37.2 C) (Oral)  Resp 21  SpO2 92%  Physical Exam  Nursing note and vitals reviewed. Constitutional: He is oriented to person, place, and time. He appears well-developed and well-nourished. No distress.  HENT:  Head: Normocephalic and atraumatic.  Mouth/Throat: Oropharynx is clear and moist.  Eyes: Conjunctivae and EOM are normal. Pupils are equal, round, and reactive to light.  Neck: Normal range of motion.  Cardiovascular: Normal rate, regular rhythm and normal heart sounds.   Pulmonary/Chest: Effort normal and breath sounds normal. No respiratory distress. He has no wheezes.  Abdominal: Soft. Bowel sounds are normal. There is no tenderness. There is no guarding.  Musculoskeletal: Normal range of motion.  Neurological: He is alert and oriented to person, place, and time. He has  normal strength. He displays no tremor. No cranial nerve deficit or sensory deficit. He displays no seizure activity. Gait normal.  Skin: Skin is warm and dry. He is not diaphoretic.  Psychiatric: He has a normal mood and affect. He is not actively hallucinating. Thought content is not delusional. He expresses no homicidal and no suicidal ideation. He expresses no suicidal plans and no homicidal plans.    ED Course   Procedures (including critical care time)  Labs Reviewed  COMPREHENSIVE METABOLIC PANEL - Abnormal; Notable for the following:    Sodium 133 (*)    Potassium 2.7 (*)    Chloride 93 (*)    Glucose, Bld 150 (*)    BUN 28 (*)    Creatinine, Ser 1.66 (*)    Albumin 3.4 (*)    AST 147 (*)    ALT 146 (*)    Alkaline Phosphatase 198 (*)    Total Bilirubin 1.6 (*)    GFR calc non Af Amer 42 (*)    GFR calc Af Amer 49 (*)    All other components within normal limits  CBC  ETHANOL  URINE RAPID DRUG SCREEN (HOSP PERFORMED)   No results found. 1. Alcohol addiction     MDM   Labs as above-- hypokalemia at 2.7, likely due to diuretics and EtOH use.  60 KCl given.  Elevated LFTs, ethanol WNL.  Pt medically cleared and placed in psych hold.  CIWA protocol, temp holding orders, and home meds placed.  [facility] consulted, they will see him.  Garlon Hatchet, PA-C 05/26/13 1558

## 2013-05-26 NOTE — ED Provider Notes (Signed)
Medical screening examination/treatment/procedure(s) were performed by non-physician practitioner and as supervising physician I was immediately available for consultation/collaboration.   Shelda Jakes, MD 05/26/13 [phone]

## 2013-05-26 NOTE — B[facility] Counselor (Signed)
Pt evaluated by [facility] NP and she wants to hold pt in ED overnight with discharge to occur in the AM with plan to enroll pt in IOP at [facility].  TTS to coordinate this in the AM when office at [facility] opens since they don't take admits on weekends.

## 2013-05-26 NOTE — BH Assessment (Signed)
Assessment Note  Kristopher [name] is an 62 y.o. male. Pt comes to [facility] seeking help for alcohol use.  Pt reports he was sober for 18 years and relapsed earlier this year.  He has been drinking 3-4 Mike's Hard Lemonades 6x week for the past 6 months.  Pt received a DWI in June and is also having some issues at work related to his alcohol use.  He also reports his wife and sons are upset about his drinking.  Pt does report some withdrawal symtptoms, tremors and sweats.  No history of siezures.  Pt was detoxed in 1996 and was sober until this year.  Pt admits to some mild depression but denies SI/HI/AV.  Axis I: alcohol dependence Axis II: Deferred Axis III:  Past Medical History  Diagnosis Date  . Hypertension   . Lower extremity edema   . Restless legs   . Hyperlipidemia   . GERD (gastroesophageal reflux disease)    Axis IV: occupational problems and problems with primary support group Axis V: 41-50 serious symptoms  Past Medical History:  Past Medical History  Diagnosis Date  . Hypertension   . Lower extremity edema   . Restless legs   . Hyperlipidemia   . GERD (gastroesophageal reflux disease)     Past Surgical History  Procedure Laterality Date  . Partial acrominonectomy and acromioplasty  07/18/2001  . Repair of the rotator cuff tendon tear  07/18/2001    utilizing three multitack sutures  . Nm myoview ltd  03/2008    EF 58% normal perfusion    Family History:  Family History  Problem Relation Age of Onset  . Cancer Mother     breast cancer  . Breast cancer Mother   . Heart disease Father     Social History:  reports that he has never smoked. He does not have any smokeless tobacco history on file. He reports that  drinks alcohol. His drug history is not on file.  Additional Social History:  Alcohol / Drug Use Pain Medications: Pt denies Prescriptions: Pt denies Over the Counter: pt denies History of alcohol / drug use?: Yes Negative Consequences of Use:  Legal;Personal relationships Withdrawal Symptoms: Tremors;Sweats Substance #1 Name of Substance 1: alcohol-Mike's Hard Lemonade 1 - Age of First Use: 18 1 - Amount (size/oz): 3-4 per day 1 - Frequency: 6x weeks 1 - Duration: 6 months 1 - Last Use / Amount: 8/15, 3 drinks  CIWA: CIWA-Ar BP: 103/61 mmHg Pulse Rate: 65 Nausea and Vomiting: no nausea and no vomiting Tactile Disturbances: none Tremor: three Auditory Disturbances: not present Paroxysmal Sweats: barely perceptible sweating, palms moist Visual Disturbances: not present Anxiety: no anxiety, at ease Headache, Fullness in Head: none present Agitation: normal activity Orientation and Clouding of Sensorium: oriented and can do serial additions CIWA-Ar Total: 4 COWS:    Allergies: No Known Allergies  Home Medications:  (Not in a hospital admission)  OB/GYN Status:  No LMP for male patient.  General Assessment Data [facility] of Assessment: [facility] ACT Assessment: Yes Is this a Tele or Face-to-Face Assessment?: Face-to-Face Is this an Initial Assessment or a Re-assessment for this encounter?: Initial Assessment Living Arrangements: Spouse/significant other Can pt return to current living arrangement?: Yes Referral Source: Self/Family/Friend     [facility] Crisis Care Plan Living Arrangements: Spouse/significant other Name of Psychiatrist: none Name of Therapist: none     Risk to self Suicidal Ideation: No Suicidal Intent: No Is patient at risk for suicide?: No Suicidal Plan?: No  Access to Means: No What has been your use of drugs/alcohol within the last 12 months?: current alcohol use Previous Attempts/Gestures: No Intentional Self Injurious Behavior: None Family Suicide History: No Recent stressful life event(s): Other (Comment) (alcohol, work stress) Persecutory voices/beliefs?: No Depression: Yes Depression Symptoms: Insomnia;Tearfulness;Fatigue;Loss of interest in usual pleasures Substance abuse history  and/or treatment for substance abuse?: Yes Suicide prevention information given to non-admitted patients: Not applicable  Risk to Others Homicidal Ideation: No Thoughts of Harm to Others: No Current Homicidal Intent: No Current Homicidal Plan: No Access to Homicidal Means: No History of harm to others?: No Assessment of Violence: None Noted Does patient have access to weapons?: No Criminal Charges Pending?: Yes Describe Pending Criminal Charges: DWI Does patient have a court date: Yes Court Date: 06/18/13  Psychosis Hallucinations: None noted Delusions: None noted  Mental Status Report Appear/Hygiene: Other (Comment) (casual) Eye Contact: Good Motor Activity: Unremarkable Speech: Logical/coherent Level of Consciousness: Alert Mood: Other (Comment) (pleasant, cooperative) Affect: Appropriate to circumstance Anxiety Level: None Thought Processes: Coherent;Relevant Judgement: Unimpaired Orientation: Person;Place;Time;Situation Obsessive Compulsive Thoughts/Behaviors: None  Cognitive Functioning Concentration: Normal Memory: Recent Intact;Remote Intact IQ: Average Insight: Good Impulse Control: Good Appetite: Good Weight Loss: 0 Weight Gain: 15 Sleep: Decreased Total Hours of Sleep: 4 Vegetative Symptoms: None  ADLScreening [facility] Assessment Services) Patient's cognitive ability adequate to safely complete daily activities?: Yes Patient able to express need for assistance with ADLs?: Yes Independently performs ADLs?: Yes (appropriate for developmental age)  Prior Inpatient Therapy Prior Inpatient Therapy: Yes Prior Therapy Dates: 1994 Prior Therapy Facilty/Provider(s): [facility] Reason for Treatment: detox  Prior Outpatient Therapy Prior Outpatient Therapy: No  ADL Screening (condition at time of admission) Patient's cognitive ability adequate to safely complete daily activities?: Yes Patient able to express need for assistance with ADLs?: Yes Independently  performs ADLs?: Yes (appropriate for developmental age)       Abuse/Neglect Assessment (Assessment to be complete while patient is alone) Physical Abuse: Denies Verbal Abuse: Denies Sexual Abuse: Denies Exploitation of patient/patient's resources: Denies Self-Neglect: Denies Values / Beliefs Cultural Requests During Hospitalization: None Spiritual Requests During Hospitalization: None   Advance Directives (For Healthcare) Advance Directive: Patient does not have advance directive;Patient would like information Patient requests advance directive information: Advance directive packet given    Additional Information 1:1 In Past 12 Months?: No CIRT Risk: No Elopement Risk: No Does patient have medical clearance?: Yes     Disposition:  Disposition Initial Assessment Completed for this Encounter: Yes Disposition of Patient: Outpatient treatment Type of outpatient treatment: Chemical Dependence - Intensive Outpatient  On Site Evaluation by:   Reviewed with Physician:    Lorri Frederick 05/26/2013 4:11 PM

## 2013-05-26 NOTE — ED Notes (Signed)
Spoke with Dr. Jodelle Gross- he wants to move pt to [facility] Psych ED.

## 2013-05-26 NOTE — ED Notes (Signed)
Pt alert, arrives from home, requesting detox from alcohol, last drink a few days ago, states 3-4 drinks day, resp even unlabored, skin pwd, denies SI/HI

## 2013-05-26 NOTE — ED Notes (Signed)
Moved pt to room 35 in psych.  Gave pt's bag of belongings to [place], Charity fundraiser.

## 2013-05-26 NOTE — Consult Note (Signed)
[facility] Psychiatry Consult   Reason for Consult:  Evaluation for inpatient treatment Referring Physician:   EDP  Kristopher [name] is an 62 y.o. male.  Assessment: AXIS I:  Alcohol Addiction/Withdrawal AXIS II:  Deferred AXIS III:   Past Medical History  Diagnosis Date  . Hypertension   . Lower extremity edema   . Restless legs   . Hyperlipidemia   . GERD (gastroesophageal reflux disease)    AXIS IV:  other psychosocial or environmental problems and problems related to social environment AXIS V:  51-60 moderate symptoms  Plan:  No evidence of imminent risk to self or others at present.   Supportive therapy provided about ongoing stressors. Refer to IOP. Discussed crisis plan, support from social network, calling 911, coming to the Emergency Department, and calling Suicide Hotline. Will monitor detox over night  Subjective:   Kristopher [name] is a 62 y.o. male.  HPI:  Patient presents to [facility] with wife.  Patient states that he has a history of alcohol addiction and fell off of the wagon 2-3 months ago.  Patient states before that he has bee sober for 20 yrs.  Patient states that he has been drinking 3-4 Mikes Hard Lemonade daily.  Patient states that he has tremors and nausea at this time.  Patient denies history of seizures.  Patient denies suicidal ideation, homicidal ideation, psychosis, and paranoia.  Patient states that he is also interested in outpatient services.  Patient wife is at bedside.    HPI Elements:   Location:  [facility] ED. Quality:  Affecting the patient mentally and physically. Severity:  Medical problem.  Past Psychiatric History: Past Medical History  Diagnosis Date  . Hypertension   . Lower extremity edema   . Restless legs   . Hyperlipidemia   . GERD (gastroesophageal reflux disease)     reports that he has never smoked. He does not have any smokeless tobacco history on file. He reports that  drinks alcohol. His drug history is not on  file. Family History  Problem Relation Age of Onset  . Cancer Mother     breast cancer  . Breast cancer Mother   . Heart disease Father            Allergies:  No Known Allergies  Past Psychiatric History: Diagnosis:  Alcohol addiction/withdrawal  Hospitalizations:  Prior [facility] alcohol detox 20 yrs ago  Outpatient Care:  No  Substance Abuse Care:  ETOH and history of Oxycodone  Self-Mutilation:  No  Suicidal Attempts:  No  Violent Behaviors:  No   Objective: Blood pressure 125/72, pulse 66, temperature 98.9 F (37.2 C), temperature source Oral, resp. rate 21, SpO2 92.00%.There is no weight on file to calculate BMI. Results for orders placed during the hospital encounter of 05/26/13 (from the past 72 hour(s))  URINE RAPID DRUG SCREEN (HOSP PERFORMED)     Status: None   Collection Time    05/26/13  9:55 AM      Result Value Range   Opiates NONE DETECTED  NONE DETECTED   Cocaine NONE DETECTED  NONE DETECTED   Benzodiazepines NONE DETECTED  NONE DETECTED   Amphetamines NONE DETECTED  NONE DETECTED   Tetrahydrocannabinol NONE DETECTED  NONE DETECTED   Barbiturates NONE DETECTED  NONE DETECTED   Comment:            DRUG SCREEN FOR MEDICAL PURPOSES     ONLY.  IF CONFIRMATION IS NEEDED     FOR  ANY PURPOSE, NOTIFY LAB     WITHIN 5 DAYS.                LOWEST DETECTABLE LIMITS     FOR URINE DRUG SCREEN     Drug Class       Cutoff (ng/mL)     Amphetamine      1000     Barbiturate      200     Benzodiazepine   200     Tricyclics       300     Opiates          300     Cocaine          300     THC              50  CBC     Status: None   Collection Time    05/26/13 11:30 AM      Result Value Range   WBC 6.4  4.0 - 10.5 K/uL   RBC 4.70  4.22 - 5.81 MIL/uL   Hemoglobin 15.0  13.0 - 17.0 g/dL   HCT 14.7  82.9 - 56.2 %   MCV 90.9  78.0 - 100.0 fL   MCH 31.9  26.0 - 34.0 pg   MCHC 35.1  30.0 - 36.0 g/dL   RDW 13.0  86.5 - 78.4 %   Platelets 212  150 - 400 K/uL   COMPREHENSIVE METABOLIC PANEL     Status: Abnormal   Collection Time    05/26/13 11:30 AM      Result Value Range   Sodium 133 (*) 135 - 145 mEq/L   Potassium 2.7 (*) 3.5 - 5.1 mEq/L   Comment: CRITICAL RESULT CALLED TO, READ BACK BY AND VERIFIED WITH:     Diona Browner [id number] @ 1224 BY J SCOTTON   Chloride 93 (*) 96 - 112 mEq/L   CO2 26  19 - 32 mEq/L   Glucose, Bld 150 (*) 70 - 99 mg/dL   BUN 28 (*) 6 - 23 mg/dL   Creatinine, Ser 2.84 (*) 0.50 - 1.35 mg/dL   Calcium 9.1  8.4 - 13.2 mg/dL   Total Protein 6.8  6.0 - 8.3 g/dL   Albumin 3.4 (*) 3.5 - 5.2 g/dL   AST 440 (*) 0 - 37 U/L   ALT 146 (*) 0 - 53 U/L   Alkaline Phosphatase 198 (*) 39 - 117 U/L   Total Bilirubin 1.6 (*) 0.3 - 1.2 mg/dL   GFR calc non Af Amer 42 (*) >90 mL/min   GFR calc Af Amer 49 (*) >90 mL/min   Comment: (NOTE)     The eGFR has been calculated using the CKD EPI equation.     This calculation has not been validated in all clinical situations.     eGFR's persistently <90 mL/min signify possible Chronic Kidney     Disease.  ETHANOL     Status: None   Collection Time    05/26/13 11:30 AM      Result Value Range   Alcohol, Ethyl (B) <11  0 - 11 mg/dL   Comment:            LOWEST DETECTABLE LIMIT FOR     SERUM ALCOHOL IS 11 mg/dL     FOR MEDICAL PURPOSES ONLY   Current Facility-Administered Medications  Medication Dose Route Frequency Provider Last Rate Last Dose  . acetaminophen (TYLENOL) tablet 650 mg  650  mg Oral Q4H PRN Garlon Hatchet, PA-C      . alum & mag hydroxide-simeth (MAALOX/MYLANTA) 200-200-20 MG/5ML suspension 30 mL  30 mL Oral PRN Garlon Hatchet, PA-C      . [START ON 05/27/2013] amLODipine (NORVASC) tablet 10 mg  10 mg Oral Daily Garlon Hatchet, PA-C      . aspirin EC tablet 81 mg  81 mg Oral Daily Garlon Hatchet, PA-C   81 mg at 05/26/13 1248  . carvedilol (COREG) tablet 25 mg  25 mg Oral BID WC Garlon Hatchet, PA-C      . chlordiazePOXIDE (LIBRIUM) capsule 25 mg  25 mg Oral Q6H  PRN Shuvon Rankin, NP      . chlordiazePOXIDE (LIBRIUM) capsule 25 mg  25 mg Oral QID Shuvon Rankin, NP   25 mg at 05/26/13 1446   Followed by  . [START ON 05/27/2013] chlordiazePOXIDE (LIBRIUM) capsule 25 mg  25 mg Oral TID Shuvon Rankin, NP       Followed by  . [START ON 05/28/2013] chlordiazePOXIDE (LIBRIUM) capsule 25 mg  25 mg Oral BH-qamhs Shuvon Rankin, NP       Followed by  . [START ON 05/29/2013] chlordiazePOXIDE (LIBRIUM) capsule 25 mg  25 mg Oral Daily Shuvon Rankin, NP      . escitalopram (LEXAPRO) tablet 10 mg  10 mg Oral QPM Garlon Hatchet, PA-C      . [START ON 05/27/2013] hydrochlorothiazide (MICROZIDE) capsule 12.5 mg  12.5 mg Oral Daily Garlon Hatchet, PA-C      . [START ON 05/27/2013] lisinopril (PRINIVIL,ZESTRIL) tablet 20 mg  20 mg Oral Daily Gwen Her, RPH       And  . Melene Muller ON 05/27/2013] hydrochlorothiazide (MICROZIDE) capsule 12.5 mg  12.5 mg Oral Daily Gwen Her, RPH      . hydrOXYzine (ATARAX/VISTARIL) tablet 25 mg  25 mg Oral Q6H PRN Shuvon Rankin, NP      . ibuprofen (ADVIL,MOTRIN) tablet 600 mg  600 mg Oral Q8H PRN Garlon Hatchet, PA-C      . loperamide (IMODIUM) capsule 2-4 mg  2-4 mg Oral PRN Shuvon Rankin, NP      . multivitamin with minerals tablet 1 tablet  1 tablet Oral Daily Shuvon Rankin, NP   1 tablet at 05/26/13 1446  . nicotine (NICODERM CQ - dosed in mg/24 hours) patch 21 mg  21 mg Transdermal Daily Garlon Hatchet, PA-C      . ondansetron [facility]) tablet 4 mg  4 mg Oral Q8H PRN Garlon Hatchet, PA-C      . ondansetron (ZOFRAN-ODT) disintegrating tablet 4 mg  4 mg Oral Q6H PRN Shuvon Rankin, NP      . oxymetazoline (AFRIN) 0.05 % nasal spray 1 spray  1 spray Each Nare BID PRN Garlon Hatchet, PA-C      . potassium chloride SA (K-DUR,KLOR-CON) CR tablet 20 mEq  20 mEq Oral Daily Garlon Hatchet, PA-C      . rOPINIRole (REQUIP) tablet 1 mg  1 mg Oral QHS Garlon Hatchet, PA-C      . thiamine (VITAMIN B-1) tablet 100 mg  100 mg Oral Daily Garlon Hatchet, PA-C   100 mg at 05/26/13 1245   Or  . thiamine (B-1) injection 100 mg  100 mg Intravenous Daily Garlon Hatchet, PA-C      . thiamine (B-1) injection 100 mg  100 mg Intramuscular Once Shuvon Rankin, NP      . [  START ON 05/27/2013] thiamine (VITAMIN B-1) tablet 100 mg  100 mg Oral Daily Shuvon Rankin, NP      . zolpidem (AMBIEN) tablet 5 mg  5 mg Oral QHS PRN Garlon Hatchet, PA-C       Current Outpatient Prescriptions  Medication Sig Dispense Refill  . amLODipine (NORVASC) 10 MG tablet Take 10 mg by mouth daily.      Marland Kitchen aspirin 81 MG tablet Take 81 mg by mouth daily.        . carvedilol (COREG) 25 MG tablet TAKE 1 TABLET TWICE DAILY  60 tablet  2  . escitalopram (LEXAPRO) 10 MG tablet Take 10 mg by mouth every evening.      . hydrochlorothiazide (MICROZIDE) 12.5 MG capsule Take 12.5 mg by mouth daily.      Marland Kitchen lisinopril-hydrochlorothiazide (PRINZIDE,ZESTORETIC) 20-12.5 MG per tablet Take 1 tablet by mouth 2 (two) times daily.      . Oxymetazoline HCl ([employer] NASAL SPRAY NA) Place 1-2 Squirts into the nose daily. AFRIN      . potassium chloride SA (KLOR-CON M20) 20 MEQ tablet TAKE 1 TABLET EVERY DAY  30 tablet  1  . Pseudoephedrine HCl (DECONGESTANT PO) Take 2 tablets by mouth 2 (two) times daily. Decongestant for patients with high B/P      . rOPINIRole (REQUIP) 1 MG tablet Take 1 mg by mouth at bedtime.         Psychiatric Specialty Exam:     Blood pressure 125/72, pulse 66, temperature 98.9 F (37.2 C), temperature source Oral, resp. rate 21, SpO2 92.00%.There is no weight on file to calculate BMI.  General Appearance: Casual and Fairly Groomed  Eye Contact::  Good  Speech:  Clear and Coherent and Normal Rate  Volume:  Normal  Mood:  Depressed  Affect:  Depressed and Flat  Thought Process:  Circumstantial, Coherent, Goal Directed and Logical  Orientation:  Full (Time, Place, and Person)  Thought Content:  WDL  Suicidal Thoughts:  No  Homicidal Thoughts:  No  Memory:   Immediate;   Good Recent;   Good Remote;   Good  Judgement:  Fair  Insight:  Good and Present  Psychomotor Activity:  Tremor  Concentration:  Fair  Recall:  Good  Akathisia:  No  Handed:  Right  AIMS (if indicated):     Assets:  Communication Skills Desire for Improvement Financial Resources/Insurance Housing Social Support  Sleep:      Treatment Plan Summary: Monitor over night.  Start Librium protocol.  Discharge in the morning with IOP services.   CW to set up with [facility] IOP  Rankin, Shuvon, FNP-BC 05/26/2013 3:09 PM

## 2013-05-26 NOTE — ED Notes (Signed)
Pt provided paper scrubs with instruction 

## 2013-05-26 NOTE — ED Notes (Addendum)
Family at bedside.  Pt eating lunch.  Awaiting new orders for librium protocol to cover CIWA score.  Pt states he has just met w/psych doctor.

## 2013-05-27 LAB — BASIC METABOLIC PANEL
CO2: 31 mEq/L (ref 19–32)
Creatinine, Ser: 1.37 mg/dL — ABNORMAL HIGH (ref 0.50–1.35)
GFR calc Af Amer: 62 mL/min — ABNORMAL LOW (ref >90)
GFR calc non Af Amer: 53 mL/min — ABNORMAL LOW (ref >90)

## 2013-05-27 MED ORDER — POTASSIUM CHLORIDE CRYS ER 20 MEQ PO TBCR
60.0000 meq | EXTENDED_RELEASE_TABLET | Freq: Two times a day (BID) | ORAL | Status: DC
  Administered 2013-05-27: 60 meq via ORAL

## 2013-05-27 MED ORDER — ERYTHROMYCIN 5 MG/GM OP OINT
TOPICAL_OINTMENT | Freq: Two times a day (BID) | OPHTHALMIC | Status: DC
  Administered 2013-05-27: 11:00:00 via OPHTHALMIC
  Filled 2013-05-27: qty 3.5

## 2013-05-27 MED ORDER — ERYTHROMYCIN 5 MG/GM OP OINT
TOPICAL_OINTMENT | Freq: Once | OPHTHALMIC | Status: DC
  Filled 2013-05-27: qty 3.5

## 2013-05-27 MED ORDER — POTASSIUM CHLORIDE CRYS ER 20 MEQ PO TBCR: 40.0000 meq | EXTENDED_RELEASE_TABLET | Freq: Two times a day (BID) | ORAL | Status: DC

## 2013-05-27 NOTE — ED Provider Notes (Signed)
I evaluated patient for hypokalemia and possible conjunctivitis.  Patient's hypokalemia is long-standing, however 2.7 is the lowest it is a has been for quite some time. Given 60 glucose of potassium yesterday. Will give another 60 mEq again today, and then give him 40 milliequivalents tonight and tomorrow morning. We'll check a BMP now and tomorrow morning. Patient has bilateral scleral injection. There is no conjunctival redness. Patient's symptoms are not consistent with conjunctivitis. I will give him erythromycin ointment mostly keep his eyes moist and lubricated.  Dagmar Hait, MD 05/27/13 [phone]

## 2013-05-27 NOTE — BH Assessment (Signed)
TC to EDP [place] to discuss pt. Call was then transferred to [facility]. Per Julieanne Cotton, pt can be d/c with resources. Writer spoke with pt and discussed importance of his following up with a primary physician as soon as possible to address his medical issues. Pt also gave pt outpatient SA resources and urged pt to call [facility] CD-IOP when he gets home. Resources listed below:  Intensive Outpatient Programs [facility]    [facility] [address]     [address] [place],  [place]     [place], [place] [phone]      [phone] Both a day and evening program   *Accepts MCD  [facility]  [facility] [address]     [address] [place], [place] [address] [phone]      [phone]  [facility]: [facility]    [facility] - Intensive Outpatient [address]     [address] [place], [place] [address]     [place], [place]  [phone] *Accepts MCD     [phone]  Residential Treatment Programs [facility] Residential Treatment    [facility] [address]     [address] [place], [place] [phone]      [phone] or [phone]  [facility]     The [address] (Several in [place]) [address], [place] [address]    [address] [place] [address]     [place], [place] [phone]      [phone]  [facility]   [facility] ([facility]) [address]     [address] [place], [place] [address]     [place], [place] [phone]      [phone] Admissions: 8am-3pm M-F  Self-Help/Support Groups [facility] [place]   Narcotics Anonymous (NA) Variety of support groups    [facility] [phone] (call for more info)    [address]        [place] - 2 meetings at this location  [employer], [place] Assessment Counselor

## 2013-05-27 NOTE — Consult Note (Signed)
Pt seen and evaluation done by me

## 2013-05-27 NOTE — Progress Notes (Signed)
Patient Identification:  Kristopher [name] Date of Evaluation:  05/27/2013   History of Present Illness:   Patient was accompanied by his wife to our ER seeking treatment for alcohol addiction and intoxication.  He was sober for 20 years and recently relapsed in the last 3 months due to job stress.  He is seeking treatment to prevent his drinking getting out of control.  He want to follow up with IOP and also seek treatment for his depression and anxiety by a psychiatrist.  He will be discharged with information for out patient care.  He is also encouraged to follow up with his PMD for his medical care.  Past Psychiatric History:  NONE   Past Medical History:     Past Medical History  Diagnosis Date  . Hypertension   . Lower extremity edema   . Restless legs   . Hyperlipidemia   . GERD (gastroesophageal reflux disease)        Past Surgical History  Procedure Laterality Date  . Partial acrominonectomy and acromioplasty  07/18/2001  . Repair of the rotator cuff tendon tear  07/18/2001    utilizing three multitack sutures  . Nm myoview ltd  03/2008    EF 58% normal perfusion    Allergies: No Known Allergies  Current Medications:  Prior to Admission medications   Medication Sig Start Date End Date Taking? Authorizing Provider  amLODipine (NORVASC) 10 MG tablet Take 10 mg by mouth daily.   Yes Historical Provider, MD  aspirin 81 MG tablet Take 81 mg by mouth daily.     Yes Historical Provider, MD  carvedilol (COREG) 25 MG tablet TAKE 1 TABLET TWICE DAILY 02/23/13  Yes Rollene Rotunda, MD  escitalopram (LEXAPRO) 10 MG tablet Take 10 mg by mouth every evening.   Yes Historical Provider, MD  hydrochlorothiazide (MICROZIDE) 12.5 MG capsule Take 12.5 mg by mouth daily.   Yes Historical Provider, MD  lisinopril-hydrochlorothiazide (PRINZIDE,ZESTORETIC) 20-12.5 MG per tablet Take 1 tablet by mouth 2 (two) times daily.   Yes Historical Provider, MD  Oxymetazoline HCl ([employer] NASAL SPRAY NA) Place  1-2 Squirts into the nose daily. AFRIN   Yes Historical Provider, MD  potassium chloride SA (KLOR-CON M20) 20 MEQ tablet TAKE 1 TABLET EVERY DAY 05/14/13  Yes Rollene Rotunda, MD  Pseudoephedrine HCl (DECONGESTANT PO) Take 2 tablets by mouth 2 (two) times daily. Decongestant for patients with high B/P   Yes Historical Provider, MD  rOPINIRole (REQUIP) 1 MG tablet Take 1 mg by mouth at bedtime.    Yes Historical Provider, MD    Social History:    reports that he has never smoked. He does not have any smokeless tobacco history on file. He reports that  drinks alcohol. His drug history is not on file.   Family History:    Family History  Problem Relation Age of Onset  . Cancer Mother     breast cancer  . Breast cancer Mother   . Heart disease Father     Mental Status Examination/Evaluation: Pleasant caucasian male who appears stated age is seen awake, alert and oriented x3.  He denies suicidal thought but reports he is depressed and anxious at times due to work stress.  His affect is congruent with his mood.  His concentration and attention is normal and he exhibetes eye contact.  His thought process and content is wnl and his judgement and insight is fair.  He agrees to follow up with a psychiatrist for his mood.  He denies SI/HI/AVH.   DIAGNOSIS:   AXIS I   Alcohol Abuse  AXIS II  Deffered  AXIS III See medical notes.  AXIS IV other psychosocial or environmental problems and problems related to social environment  AXIS V 61-70 mild symptoms     Assessment/Plan:  Consult and face to face interview with Dr Lolly Mustache Patient will be discharged after lab check for potasium level He will be referred to our clinic for IOP and to start consult with a psychiatrist He is also encouraged to follow up with his PMDin regards to his Kidney and liver functions.  I have personally seen the patient and agreed with the findings and involved in the treatment plan. Kathryne Sharper, MD

## 2013-05-31 ENCOUNTER — Encounter (HOSPITAL_COMMUNITY): Payer: Self-pay | Admitting: Psychology

## 2013-06-02 ENCOUNTER — Other Ambulatory Visit: Payer: Self-pay | Admitting: Cardiology

## 2013-06-03 ENCOUNTER — Other Ambulatory Visit (HOSPITAL_COMMUNITY): Payer: BC Managed Care – PPO | Attending: Psychiatry | Admitting: Psychology

## 2013-06-03 DIAGNOSIS — F1020 Alcohol dependence, uncomplicated: Secondary | ICD-10-CM

## 2013-06-04 ENCOUNTER — Encounter (HOSPITAL_COMMUNITY): Payer: Self-pay | Admitting: Psychology

## 2013-06-04 NOTE — Progress Notes (Signed)
    Daily Group Progress Note  Program: CD-IOP   Group Time: 1-2:30 pm  Participation Level: Active  Behavioral Response: Appropriate and Sharing  Type of Therapy: Process Group  Topic: Group Process: the first part of group was spent in process members shared about their days in early recovery since the last group session. Group had been cancelled on Friday due to energy problems. Two new members were also present and they received a warm welcome. A number of members shared about putting themselves in high risk situations and those were processed by members. Halfway through this session, because of continued problems with the [facility], group moved out into the courtyard under the covered shelter. It was cooler and there was a delightful breeze. The remainder of this session was spent outside.   Group Time: 2:45-4 pm  Participation Level: Active  Behavioral Response: Sharing  Type of Therapy: Psycho-education Group  Topic: Dopamine Deficiency/Graduation: the second half of group was spent in a review of the dopamine deficit identified within the brains of addicts and alcoholics. Addiction develops out of the search to find whatever makes that individual feel whole again. This is most easily experienced through drugs and alcohol. Members shared the struggles they have had and many expressed frustration over fellow group members and their willingness to put themselves in high risk situations. As the session neared an end, a graduation was held for a member who was leaving the program having completed successfully. Kind words and plenty of hope were shared and the graduating member expressed his appreciation to his fellow group members and encouraged them to take advantage of this experience.     Summary:  The patient was new to the group and shared open about his addiction. He described himself as an addict and an alcoholic. He reported he had been in detox 20 years ago for his drinking and  hadn't had a drink for 20 years. He had back surgery less than a year ago and was prescribed narcotic pain medication. He was abusing the medication and when he stopped taking it, he substituted alcohol for the pills. Plus, he admitted he had suffered from withdrawal from the narcotics and the alcohol eased some of those symptoms. His family was not aware of the pain meds and did not know about his drinking until he was arrested for DWI in June of this year. He admitted that his family was very surprised and upset when they were informed of his arrest. The patient reported he had attended AA for about a year, but figured he had it licked and had stopped going. He made some good comments and received good feedback in his first session. His sobriety date is 8/16.   Family Program: Family present? No   Name of family member(s):   UDS collected: No Results:  AA/NA attended?: YesFriday and Saturday  Sponsor?: No   Audrena Talaga, LCAS

## 2013-06-05 ENCOUNTER — Other Ambulatory Visit (HOSPITAL_COMMUNITY): Payer: BC Managed Care – PPO | Admitting: Psychology

## 2013-06-05 DIAGNOSIS — F10230 Alcohol dependence with withdrawal, uncomplicated: Secondary | ICD-10-CM

## 2013-06-05 DIAGNOSIS — F1020 Alcohol dependence, uncomplicated: Secondary | ICD-10-CM | POA: Diagnosis not present

## 2013-06-06 NOTE — Progress Notes (Unsigned)
Patient ID: Kristopher [name], male   DOB: July 26, 1951, 62 y.o.   MRN: [id number] Orientation to CD-IOP: The patient is a 62 yo, married, caucasian, male seeking entry into the CD-IOP to address his chemical dependency. The patient lives in [place], [place], a small community just outside of [place]. The patient began drinking when he was 61 yo and drank for years until he finally addressed his alcoholism and entered detox at [facility] in 1994 - approximately 20 years ago. He reported he was engaged and active in AA for about a year, but then felt as if he had put this problem behind him and stopped going to meetings.  The patient had surgery to address back problems in 2012 and was prescribed narcotic pain medication. The patient had not shared his history of addiction or treatment with his surgeron and did not disclosure to his family that he continued to take them for two years. In May of this year, the patient was tired of his opiate addiction and the struggles with withdrawal when he ran out of pills too soon.  He decided to stop taking the pain medication, but found himself drinking to address the discomfort of withdrawal. On June 16th the patient was arrested and charged with a DWI. His family was shocked when contacted about his arrest because he had hidden his drinking from them and they had no idea he had been abusing anything. The patient was very open and honest about what he had been doing and appeared willing, remorseful, and ready to address his problem.  He reported that his father and paternal grandfather had both died of problems related to their alcoholism. He has two grown sons and neither one of them use alcohol or any type of drug to his knowledge. In addition to his CD, the patient is prescribed Effexor XR for depression and Trazadone for sleep. The patient also suffers from hypertension. The documentation was reviewed, signed, and the orientation completed accordingly. Because  of previous commitments, the patient will not return today for group, but appear at 1 pm on Monday, August 25th, and begin the CD-IOP.

## 2013-06-07 ENCOUNTER — Other Ambulatory Visit (HOSPITAL_COMMUNITY): Payer: BC Managed Care – PPO

## 2013-06-10 ENCOUNTER — Encounter (HOSPITAL_COMMUNITY): Payer: Self-pay | Admitting: Psychology

## 2013-06-10 NOTE — Progress Notes (Signed)
    Daily Group Progress Note  Program: CD-IOP   Group Time: 1-2:30 pm  Participation Level: Active  Behavioral Response: Appropriate and Sharing  Type of Therapy: Psycho-education Group  Topic: Codependency: The first part of group was spent in a presentation about Codependency. A guest facilitator, Cameron Ali, was the presenter. She provided handouts and the group completed a questionnaire. Members were invited to volunteer some of their answers to the questions. A good discussion ensued and members provided good feedback about their own experiences in relationships and some of the dysfunctional behaviors that they had engaged in during those relationships. The significance of parents or grandparents and how their addictions have impacted these group members was also discussed at length. The discussion was compelling with honest sharing among the group.     Group Time: 2:45- 4pm  Participation Level: Minimal  Behavioral Response: Sharing  Type of Therapy: Process Group  Topic: Group process: the second half of group was spent in process. Members shared about current issues and concerns in early recovery. The struggles so common, especially PAWS, were discussed and the new group members reminded they will meet with the medical director on Friday. As the group concluded, members identified what meetings they intended to go to this evening and they provided good suggestions to one of the new members who is going to his first meeting tonight.    Summary: the patient was attentive and active in the discussion on codependency. Despite being married for 40+ years, he admitted too many of the descriptions provided in the handout. The patient shared about the dishonesty about his using and the way it fed into dysfunctional behavioral patterns with his wife and children. The patient was very honesty about some of the unhealthy ways he acted and his contributions to family disharmony. In  process, the patient provided good suggestions for one of the members who had recently decided to join a gym. He reminded the group that he had hurt his back trying to life too much weight and had subsequent surgeries as a result. He encouraged them to go slowly at the gym. The patient is new with only a few sessions under his belt, but he presents as genuine and wanting to uncover and change his addictive patterns. His sobriety date remains 8/16.   Family Program: Family present? No   Name of family member(s):   UDS collected: Yes Results: not returned from lab}  AA/NA attended?: [place] and Tuesday  Sponsor?: No   Kristopher [name], LCAS

## 2013-06-12 ENCOUNTER — Other Ambulatory Visit (HOSPITAL_COMMUNITY): Payer: BC Managed Care – PPO | Attending: Psychiatry | Admitting: Psychology

## 2013-06-12 DIAGNOSIS — F1020 Alcohol dependence, uncomplicated: Secondary | ICD-10-CM

## 2013-06-14 ENCOUNTER — Other Ambulatory Visit (HOSPITAL_COMMUNITY): Payer: BC Managed Care – PPO | Admitting: Psychology

## 2013-06-14 ENCOUNTER — Other Ambulatory Visit (HOSPITAL_COMMUNITY): Payer: Self-pay | Admitting: Physician Assistant

## 2013-06-14 DIAGNOSIS — F1020 Alcohol dependence, uncomplicated: Secondary | ICD-10-CM | POA: Diagnosis not present

## 2013-06-14 MED ORDER — HYDROXYZINE HCL 25 MG PO TABS: 25.0000 mg | ORAL_TABLET | Freq: Every day | ORAL | Status: DC

## 2013-06-14 NOTE — Progress Notes (Signed)
    Daily Group Progress Note  Program: CD-IOP   Group Time: 91:00-2:30 pm   Participation Level: Active  Behavioral Response: Appropriate  Type of Therapy: Process Group  Topic: Pt reports his sobriety date as 05/25/13. Pt shared his reason for seeking sobriety treatment and how his drinking was negatively affecting his employment. He provided encouragement to another member who questioned having addiction and gave him support and encouragement.      Group Time: 2:30-4:00 pm  Participation Level: Active  Behavioral Response: Appropriate  Type of Therapy: Psycho-education Group  Topic: Boundaries Part I   Summary: Pt was introduced to Boundary setting as a way to manage wellness and prevent addiction relapse. Pt learned about the four personality styles that impact ones ability to set healthy boundaries and learned which one they currently identify with.      EVANS,ANN, LCAS

## 2013-06-14 NOTE — Progress Notes (Signed)
    Daily Group Progress Note  Program: CD-IOP   Group Time: 1:00-2:30 pm  Participation Level: Active  Behavioral Response: Appropriate  Type of Therapy: Process Group  Topic: Pt reports his sobriety date as 05/25/13. He talked about how he is managing job stress and using hobbies over the weekend to bring enjoyment into his life to manage urges to drink.      Group Time: 2:30-4:00 pm  Participation Level: Active  Behavioral Response: Appropriate  Type of Therapy: Psycho-education Group  Topic: Boundaries Part2   Summary: Pt learned the five steps to setting healthy boundaries using personal life examples and identified areas of change they need to make to set better limits to ensure sobriety.      AA/NA attended?: YesThursday  Sponsor?: Yes   EVANS,ANN, LCAS

## 2013-06-17 ENCOUNTER — Other Ambulatory Visit (HOSPITAL_COMMUNITY): Payer: BC Managed Care – PPO | Admitting: Psychology

## 2013-06-17 ENCOUNTER — Encounter (HOSPITAL_COMMUNITY): Payer: Self-pay | Admitting: Psychology

## 2013-06-17 DIAGNOSIS — F1020 Alcohol dependence, uncomplicated: Secondary | ICD-10-CM

## 2013-06-18 ENCOUNTER — Encounter (HOSPITAL_COMMUNITY): Payer: Self-pay | Admitting: Psychology

## 2013-06-18 NOTE — Progress Notes (Signed)
    Daily Group Progress Note  Program: CD-IOP   Group Time: 1-2:30 pm  Participation Level: Active  Behavioral Response: Appropriate and Sharing  Type of Therapy: Process Group  Topic: Group Process: the first part of group as spent in process. Members shared about the past weekend and their issues and concerns in early recovery. There was good feedback among members and the session proved therapeutic by allowing members to vent and open up about their struggles.   Group Time: 2:45-4pm  Participation Level: Active  Behavioral Response: Appropriate and Sharing  Type of Therapy: Psycho-education Group  Topic: Finding Balance in Early Recovery: the second half of group was spent in a psycho-ed session. "The Recovery Pie" was drawn on the board with 8 pieces. These include eight different elements of life that should be considered on a daily basis, including diet, sleep, attending 12-step meetings, step-work with a sponsor, medications, spiritual growth, having FUN, and exercise. The importance of finding balance and addressing one's needs for good health and general well-being was emphasized. Each member shared where he or she is lacking and how they might address this deficiency.   Summary: The patient reported a good weekend spent tinkering with his cars in his workshop. However, he expressed frustration with his wife who constantly monitors him in early recovery. He admitted she even 'counts the aspirin" he takes. This past Saturday, he returned later than anticipated after attending a car show in [place]. When he arrived 30 minutes late, she was crying and very upset. She had thought he had been drinking. The patient admitted he should have phoned to let her know he was running late of his late-. The group reminded him that he has lost her trust after taking pills and drinking for almost two years without her knowledge. He will only regain her trust through time and behavior. The  opportunity to emphasize the importance of family members getting treatment in the form of Al-Anon was taken. 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 The patient continues to make good progress and use the group to explore his feelings and struggles in early recovery. His sobriety date remains 8/16.   Family Program: Family present? No   Name of family member(s):   UDS collected: No Results:   AA/NA attended?: YesFriday and Saturday  Sponsor?: No, but he is actively looking to secure a sponsor   Ardon Franklin, LCAS

## 2013-06-19 ENCOUNTER — Other Ambulatory Visit (HOSPITAL_COMMUNITY): Payer: BC Managed Care – PPO | Admitting: Psychology

## 2013-06-19 DIAGNOSIS — F1020 Alcohol dependence, uncomplicated: Secondary | ICD-10-CM

## 2013-06-19 NOTE — Progress Notes (Unsigned)
Patient ID: Kristopher [name], male   DOB: Mar 06, 1951, 62 y.o.   MRN: [id number] CD-IOP: Treatment Planning Session. I met with the patient today at the conclusion of group. The patient lives near [facility] and is still working so it is best to meet on a day he travels to [place] for his CD-IOP group session. I explained the importance of goals and how treatment plan is written.  The patient agreed with the first two treatment goals, which include: remaining alcohol and drug-free and building support for his recovery. The patient is attending 4-5 AA meetings per week and is looking and listening for a new sponsor. When asked about a third goal, the patient reported he would like to rebuild his relationship with his wife. He explained that since his drug use and relapse was discovered, his wife has lost trust in him and is constantly questioning him. He had reported in the group session today that he had come home about 30 minutes late from a car show this weekend and had found her crying. She was very upset and felt like he had probably been drinking. She is also trying to manage his meds and he noted, "she even counts the aspirin'. I reminded the patient that trust is hard to regain and it will take time and displays of consistent behaviors by him if she is ever going to be able to relax and trust him fully in the future. I suggested she attend Al-Anon meetings which will allow her to vent her pain and frustrations and encourage her to focus on herself and not her husband. I also asked that he consider bringing her to a session sometime and the three of [place] can talk about his treatment. The patient liked this idea and agreed to figure out what day would work best with them. The documentation was completed and the treatment goals identified and signed. The patient is good in group and very open about his addiction and the struggles within himself and with his family in early recovery. His sobriety date remains 8/16.

## 2013-06-20 ENCOUNTER — Encounter (HOSPITAL_COMMUNITY): Payer: Self-pay | Admitting: Psychology

## 2013-06-20 NOTE — Progress Notes (Signed)
    Daily Group Progress Note  Program: CD-IOP   Group Time: 1-2:30 pm  Participation Level: Active  Behavioral Response: Appropriate and Sharing  Type of Therapy: Process Group  Topic: Group process: the first part of group was spent in process. Members shared about the past few days and progress made or challenges addressed in early recovery. A new group member was present and he introduced himself to the group and explained why he had come here to the program. There was good disclosure and very supportive feedback offered to those particularly struggling today. One member admitted she felt so much better after having shared the problems she was dealing with and they looked differently to her after having opened up and received feedback from her fellow group members.   Group Time: 2:45- 4pm  Participation Level: Active  Behavioral Response: Appropriate and Sharing  Type of Therapy: Psycho-education Group  Topic: Chaplain: Spirituality. The second half of group was spent in a psycho-ed session with a chaplain from the [facility]. Leola Brazil was here today and led a discussion about finding meaning and values. She challenged members to share their definitions of just what 'Spirituality' means. While some members have a strong and active faith, other members admitted they considered themselves spiritual, but not religious. A few admitted they didn't believe in anything. The session proved very informative and challenged members to consider different ways of looking at their lives. It also invited members to consider what values they hold most dear.   Summary: The patient reported he is doing well. He had had a good day and is feeling good about himself and his recovery. In the second half of group, the patient shared how he had been very angry when a minister in his church had been removed because he didn't have enough fire and brimstone. Ever since then, he admitted, religion  was something bad - very hypocritical - and he had found his spirituality outdoors with animals. He described his experiences that have contributed to his 'spirit' and spoke very eloquently about them. When we talked about values, he reported that being validated for a job well-done is something he finds very fulfilling. The patient admitted his boss of 20 some years never compliments anybody on anything, but is very quick to criticize. The patient continues to make excellent progress and he responded very well to this intervention. His sobriety date remains 8/16.    Family Program: Family present? No   Name of family member(s):   UDS collected: Yes Results:not returned  AA/NA attended?: YesMonday  Sponsor?: No, but he actively seeking a sponsor   Makinley Muscato, LCAS

## 2013-06-21 ENCOUNTER — Other Ambulatory Visit (HOSPITAL_COMMUNITY): Payer: BC Managed Care – PPO | Admitting: Psychology

## 2013-06-21 DIAGNOSIS — F1020 Alcohol dependence, uncomplicated: Secondary | ICD-10-CM

## 2013-06-23 ENCOUNTER — Encounter (HOSPITAL_COMMUNITY): Payer: Self-pay | Admitting: Psychology

## 2013-06-23 NOTE — Progress Notes (Signed)
    Daily Group Progress Note  Program: CD-IOP   Group Time: 1-2:30 pm  Participation Level: Active  Behavioral Response: Appropriate and Sharing  Type of Therapy: Process Group  Topic: Group Process' the first half of group was spent in process. Members shared about current issues and  concerns in early recovery. One member complained and made what seemed like excuses, but another member challenged him to make the needed changes that are required to stop using. There was good feedback and disclosure among members and the session proved effective.  Group Time: 2:45-4pm  Participation Level: Active  Behavioral Response: Sharing  Type of Therapy: Psycho-education Group  Topic: Criteria for Substance Dependence/Graduation: the second half of group was spent in a psycho-ed session with a handout and discussion on the criteria for substance dependence as found in the DSM-V. Every group member except one, admitted meeting at least 6 of the 7 criteria, while the newest member admitted only four. This proved very enlightening to a few members while most of the group had never denied their addiction. There were other issues that came to light in this session and good disclls8ure and feedback among the group. At the conclusion of the session a graduation ceremony was held. Kind and hopeful words were shared and the graduating member noted she was very pleased by the support of her fellow group members.  Summary: The patient reported he was doing well. He has continued working while in this program. He provided good support to his fellow group members. He admitted he met all 7 of the criteria identified in the handout. The patient continues to make good comments and provide excellent feedback in this program. He remains sober with a date of 8/16. He continues to seek a sponsor to begin working with.   Family Program: Family present? No   Name of family member(s):   UDS collected: No  Results:  AA/NA attended?: YesThursday  Sponsor?: No, but he is seeking a sponsor   Kalyn Dimattia, LCAS

## 2013-06-24 ENCOUNTER — Other Ambulatory Visit (HOSPITAL_COMMUNITY): Payer: BC Managed Care – PPO | Admitting: Psychology

## 2013-06-24 DIAGNOSIS — F1120 Opioid dependence, uncomplicated: Secondary | ICD-10-CM

## 2013-06-24 DIAGNOSIS — F1020 Alcohol dependence, uncomplicated: Secondary | ICD-10-CM | POA: Diagnosis not present

## 2013-06-26 ENCOUNTER — Other Ambulatory Visit (HOSPITAL_COMMUNITY): Payer: BC Managed Care – PPO | Admitting: Psychology

## 2013-06-26 ENCOUNTER — Encounter (HOSPITAL_COMMUNITY): Payer: Self-pay | Admitting: Psychology

## 2013-06-26 DIAGNOSIS — F1020 Alcohol dependence, uncomplicated: Secondary | ICD-10-CM

## 2013-06-26 DIAGNOSIS — F1120 Opioid dependence, uncomplicated: Secondary | ICD-10-CM

## 2013-06-26 DIAGNOSIS — F1111 Opioid abuse, in remission: Secondary | ICD-10-CM | POA: Insufficient documentation

## 2013-06-26 NOTE — Progress Notes (Signed)
    Daily Group Progress Note  Program: CD-IOP   Group Time: 1-2:30 pm  Participation Level: Active  Behavioral Response: Appropriate and Sharing  Type of Therapy: Process Group  Topic: Group Process: the first part of group was spent in process. Members shared about their past weekend and any concerns or issues that may have arisen. One member was very upset and teary and shared that a very close friend had relapsed and he was concerned that this person could die. This invited a brief discussion on the ability to control only one's self and how futile it is to try, despite many efforts, to try and control others. It was also a good experience for him and other group members to experience how it feels to be on the 'other side'. That would be the side that is witnessing a loved one who is using drugs and the fears about safety and even death. Three group members were absent and the group as noticeably smaller than in previous sessions.   Group Time: 2:45- 4pm  Participation Level: Active  Behavioral Response: Appropriate  Type of Therapy: Psycho-education Group  Topic: HeartMath and Stress: the second part of group as spent in a psychoed session on the problems that develop through Stress and a training exercise on "HeartMath" to combat Stress. A brief discussion ensued on Stress with an accompanying handout. Despite being familiar with the dangers of stress, no one could identify, beyond increased blood pressure, why stress was so damaging and destructive. An explanation of HeartMath was provided and members proceeded to hook up individually to the system while other group members observed their progress. The session proved very effective with almost all members, except one, experiencing a considerable reduction in stress and the ability to breath slowly and control their heart rate. Members seemed genuinely interested in this process.   Summary: The patient reported he had had a good  weekend. He had attended two AA meetings and a car show on Saturday morning. He has reported in group previously that he has on old car and is an IT sales professional. The patient did well in the [employer] [place] and agreed that it was an interesting exercise. He was encouraged to practice it at least once a day and learn to breath more easily and rhythmically and it will become natural to him. The patient reported he had had another "Ahhh." moment today. 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 He described how cute they were and how they will soon be a big hit for their patrons. The patient continues to make good progress and his sobriety date is 8/16.    Family Program: Family present? No   Name of family member(s):   UDS collected: No Results:   AA/NA attended?: YesFriday and Sunday  Sponsor?: No, but he is actively looking for a sponsor   Harnoor Reta, LCAS

## 2013-06-27 ENCOUNTER — Encounter (HOSPITAL_COMMUNITY): Payer: Self-pay | Admitting: Psychology

## 2013-06-27 NOTE — Progress Notes (Addendum)
Psychiatric Assessment Adult  Patient Identification:  Kristopher [name] Date of Evaluation:  06/14/2013 Chief Complaint: Opiate addiction History of Chief Complaint:   Chief Complaint  Patient presents with  . 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 He continued to use opiate pain killers until May of this year when he found himself running out early and having to experience symptoms of withdrawal. In order to deal with the symptoms of withdrawal he relapsed on alcohol. In June he was arrested for driving while intoxicated. He has a history of recovery, with a period of approximately 18 years of abstinence, after he sought help for alcoholism and was hospitalized at [facility] in 1996. He very much wants to return to his life of recovery, and has a new sobriety date of 05/25/2013.  Kristopher [name] is denying any symptoms of depression, bipolar disorder, anxiety, OCD, or traumatic exposure. He denies any suicidal or homicidal thoughts. He denies any auditory or visual hallucinations.  Review of Systems  Constitutional: Negative.   HENT: Negative.   Eyes: Negative.   Respiratory: Negative.   Cardiovascular: Negative.   Gastrointestinal: Negative.   Endocrine: Negative.   Genitourinary: Negative.   Musculoskeletal: Negative.   Skin: Negative.   Allergic/Immunologic: Negative.   Neurological: Negative.   Hematological: Negative.   Psychiatric/Behavioral: Negative.    Physical Exam  Constitutional: He is oriented to person, place, and time. He appears well-developed and well-nourished.  HENT:  Head: Normocephalic and atraumatic.  Eyes: Conjunctivae are normal. Pupils are equal, round, and reactive to light.  Neck: Normal range of motion.  Musculoskeletal: Normal range of motion.  Neurological: He is alert and oriented to person, place, and time.   Psychiatric: He has a normal mood and affect. His behavior is normal. Judgment and thought content normal.    Depressive Symptoms: Patient denies any symptoms of depression  (Hypo) Manic Symptoms:  Patient denies any history of mania  Anxiety Symptoms: Patient denies any history of anxiety  Psychotic Symptoms: Patient denies any history of psychosis  PTSD Symptoms: Patient denies any traumatic exposure  Traumatic Brain Injury: No   Past Psychiatric History: Diagnosis: None   Hospitalizations: [facility] in 1998   Outpatient Care: None   Substance Abuse Care: AA   Self-Mutilation: Denies   Suicidal Attempts: Denies   Violent Behaviors: Denies    Past Medical History:   Past Medical History  Diagnosis Date  . Hypertension   . Lower extremity edema   . Restless legs   . Hyperlipidemia   . GERD (gastroesophageal reflux disease)   . Osteoarthritis     Spine, hips, knees  . Insomnia    History of Loss of Consciousness:  No Seizure History:  No Cardiac History:  No Allergies:  No Known Allergies Current Medications:  Current Outpatient Prescriptions  Medication Sig Dispense Refill  . amLODipine (NORVASC) 10 MG tablet Take 10 mg by mouth daily.      Marland Kitchen aspirin 81 MG tablet Take 81 mg by mouth daily.        Marland Kitchen atorvastatin (LIPITOR) 40 MG tablet TAKE 1 TABLET EVERY DAY  30 tablet  2  . carvedilol (COREG) 25 MG tablet TAKE 1 TABLET TWICE DAILY  60 tablet  2  . escitalopram (LEXAPRO) 10 MG tablet Take 10 mg by mouth every evening.      Marland Kitchen  hydrochlorothiazide (MICROZIDE) 12.5 MG capsule Take 12.5 mg by mouth daily.      . hydrOXYzine (ATARAX/VISTARIL) 25 MG tablet Take 1-2 tablets (25-50 mg total) by mouth at bedtime.  30 tablet  0  . lisinopril-hydrochlorothiazide (PRINZIDE,ZESTORETIC) 20-12.5 MG per tablet Take 1 tablet by mouth 2 (two) times daily.      Marland Kitchen lisinopril-hydrochlorothiazide (PRINZIDE,ZESTORETIC) 20-12.5 MG per tablet TAKE 1 TABLET TWICE DAILY  60 tablet   1  . Oxymetazoline HCl ([employer] NASAL SPRAY NA) Place 1-2 Squirts into the nose daily. AFRIN      . potassium chloride SA (KLOR-CON M20) 20 MEQ tablet TAKE 1 TABLET EVERY DAY  30 tablet  1  . Pseudoephedrine HCl (DECONGESTANT PO) Take 2 tablets by mouth 2 (two) times daily. Decongestant for patients with high B/P      . rOPINIRole (REQUIP) 1 MG tablet Take 1 mg by mouth at bedtime.        No current facility-administered medications for this visit.    Previous Psychotropic Medications:  Medication Dose   Lexapro   10 mg daily                      Substance Abuse History in the last 12 months: Substance Age of 1st Use Last Use Amount Specific Type  Nicotine          Alcohol  17  05/24/2013 3-4    Cannabis  college college    Opiates  60 02/20/2013  Oxycodone  Cocaine          Methamphetamines          LSD          Ecstasy           Benzodiazepines          Caffeine          Inhalants          Others:                          Medical Consequences of Substance Abuse: None known   Legal Consequences of Substance Abuse: DUI June 2014   Family Consequences of Substance Abuse: Much concern   Blackouts:  No DT's:  No Withdrawal Symptoms:  Yes   Social History: Kristopher [name] was born in [place], [place], and grew up in [place], [place]. He has a younger sister. His mother died when he was 86 years of age, and his father died in 67. 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 He graduated with a bachelor of arts degree in journalism from [employer]. 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 He has been married for 42 years, and they have 2 sons. He has a DUI pending. He affiliates as an attack. His hobbies include collecting cars and motorcycles, as well as fishing. His social support  system consists of his wife, his friends at work, and Kristopher [name] members of [place]..   Family History:   Family History  Problem Relation Age of Onset  . Cancer Mother     breast cancer  . Breast cancer Mother   . Heart disease Father   . Alcohol abuse Father   . Alcohol abuse Paternal Grandfather   .  Alcohol abuse Cousin     Mental Status Examination/Evaluation: Objective:  Appearance: Casual  Eye Contact::  Good  Speech:  Clear and Coherent  Volume:  Normal  Mood:  Euthymic   Affect:  Appropriate  Thought Process:  Linear  Orientation:  Full (Time, Place, and Person)  Thought Content:  WDL  Suicidal Thoughts:  No  Homicidal Thoughts:  No  Judgement:  Fair  Insight:  Fair  Psychomotor Activity:  Normal  Akathisia:  No  Handed:    AIMS (if indicated):    Assets:  Communication Skills Desire for Improvement Financial Resources/Insurance Housing Intimacy Leisure Time Physical Health Resilience Social Support Talents/Skills Transportation Vocational/Educational    Laboratory/X-Ray Psychological Evaluation(s)        Assessment:    AXIS I Alcohol Abuse and Substance Induced Mood Disorder  AXIS II Deferred  AXIS III Past Medical History  Diagnosis Date  . Hypertension   . Lower extremity edema   . Restless legs   . Hyperlipidemia   . GERD (gastroesophageal reflux disease)   . Osteoarthritis     Spine, hips, knees  . Insomnia      AXIS IV problems related to legal system/crime  AXIS V 51-60 moderate symptoms   Treatment Plan/Recommendations:  Plan of Care: Admit to IOP where he will attend group therapy sessions 3 days weekly for 3 hours each session. Patient is expected to attend a minimum of 4 recovery based activities weekly in addition to attending group. Initiate Vistaril 25-50 mg at bedtime for sleep.   Laboratory:  UDS  Psychotherapy: Attend group   Medications: Vistaril 25-50 mg at bedtime as needed for sleep   Routine PRN Medications:  No   Consultations: None   Safety Concerns:  Risk for relapse   Other:      EVANS,ANN, LCAS 9/18/201412:02 PM

## 2013-06-28 ENCOUNTER — Other Ambulatory Visit (HOSPITAL_COMMUNITY): Payer: BC Managed Care – PPO | Admitting: Psychology

## 2013-06-28 DIAGNOSIS — F1020 Alcohol dependence, uncomplicated: Secondary | ICD-10-CM | POA: Diagnosis not present

## 2013-06-28 DIAGNOSIS — F10239 Alcohol dependence with withdrawal, unspecified: Secondary | ICD-10-CM

## 2013-06-28 DIAGNOSIS — F1120 Opioid dependence, uncomplicated: Secondary | ICD-10-CM

## 2013-06-29 ENCOUNTER — Encounter (HOSPITAL_COMMUNITY): Payer: Self-pay | Admitting: Psychology

## 2013-06-29 NOTE — Progress Notes (Signed)
    Daily Group Progress Note  Program: CD-IOP   Group Time: 1-2:30 pm  Participation Level: Active  Behavioral Response: Appropriate and Sharing  Type of Therapy: Activity group  Topic: Guest Speaker: the first half of group was spent with a visitor. He was a former group member who has attained over 16 months of sobriety. The patient shared about his struggles and those practices that have allowed him to remain sober. The emphasis was on the importance of the Fellowship of AA/NA, attending meetings consistently, and securing a sponsor. He invited feedback and the visited generated some good discussion.   Group Time: 2:45-4pm  Participation Level: Active  Behavioral Response: Appropriate and Sharing  Type of Therapy: Process Group  Topic: Group Process: the second half of group was spent in process. Members shared about current issues and challenges in recovery. A new group member was present and she was invited to talk about what had brought her here and what she was seeking from the group. Drug tests collected on Monday were returned and three group members were asked to explain their positive results. None of them had shared anything of concern on Monday and had maintained their previous sobriety dates. The relapses were discussed and ways that they might have avoided the drug and alcohol use identified.  Summary: The patient reported he was doing well. He described his recovery practices to the visiting speaker. He admitted he had attended AA for about a year when he first got sober twenty years ago. As the visitor had mentioned, this fellow agreed that he had gotten 'complacent'. the patient reported he was doing well and is seeking a sponsor. He admitted he has found NA more comfortable for him and he is attending four meetings per week. The patient responded well to this intervention and his sobriety date remains 8/16.     Family Program: Family present? No   Name of family  member(s):   UDS collected: No Results:  AA/NA attended?: [place]  Sponsor?: No,  But he is actively seeking a sponsor   Jadarian Mckay, LCAS

## 2013-07-01 ENCOUNTER — Telehealth (HOSPITAL_COMMUNITY): Payer: Self-pay | Admitting: Psychology

## 2013-07-01 ENCOUNTER — Encounter (HOSPITAL_COMMUNITY): Payer: Self-pay | Admitting: Psychology

## 2013-07-01 ENCOUNTER — Other Ambulatory Visit (HOSPITAL_COMMUNITY): Payer: BC Managed Care – PPO

## 2013-07-01 NOTE — Progress Notes (Signed)
    Daily Group Progress Note  Program: CD-IOP   Group Time: 1-2:30 pm  Participation Level: Active  Behavioral Response: Appropriate and Sharing  Type of Therapy: Process Group  Topic: Group Process: the first part of group was spent in process. Members checked-in and a new group member introduced herself. During this session, the medical director was meeting with current members to discuss meds and the new group members. One member disclosed being shocked about a drug dream he had had the night before. Others also shared about drug dreams. One member shared about resentment with his sponsor and this generated a good discussion.   Group Time: 2:45- 4pm  Participation Level: Active  Behavioral Response: Sharing  Type of Therapy: Psycho-education Group  Topic: Psycho-Ed: the second half of group was spent in a [employer] Slide Show on early recovery. Different aspects of recovery and the specific vulnerabilities at different times were identified. Members shared about how they had relapsed and were able to identify these vulnerabilities or lapses in their program. The new group member shared at length about her history of drug use and the specifics of her relapse after 3 years of sobriety. The session proved effective for everyone present.   Summary:The patient reported he was doing well and had secured a 'temporary' sponsor. He is attending NA meetings, including two per week that meet in [facility]. During the power point presentation, the patient pointed out things that he had done, including becoming complacent, that led to his relapse. He admitted he had stopped attending 12-step meetings after about a year and a half because he thought he 'had it'. The patient continues to make good progress and his sobriety date remains 8/16.    Family Program: Family present? No   Name of family member(s):   UDS collected: No Results:  AA/NA attended?: YesTuesday and Wednesday  Sponsor?:  Yes   Amonie Wisser, LCAS

## 2013-07-03 ENCOUNTER — Other Ambulatory Visit (HOSPITAL_COMMUNITY): Payer: BC Managed Care – PPO | Admitting: Psychology

## 2013-07-03 DIAGNOSIS — F331 Major depressive disorder, recurrent, moderate: Secondary | ICD-10-CM

## 2013-07-03 DIAGNOSIS — F1120 Opioid dependence, uncomplicated: Secondary | ICD-10-CM

## 2013-07-03 DIAGNOSIS — F1020 Alcohol dependence, uncomplicated: Secondary | ICD-10-CM

## 2013-07-04 ENCOUNTER — Encounter (HOSPITAL_COMMUNITY): Payer: Self-pay | Admitting: Psychology

## 2013-07-04 NOTE — Progress Notes (Unsigned)
    Daily Group Progress Note  Program: CD-IOP   Group Time: 1-2:30 pm  Participation Level: Minimal  Behavioral Response: Appropriate and Sharing  Type of Therapy: Education and Training Group  Topic: Education: The first half of group was spent with one of the pharmacists housed upstairs in the residential facility. Peggye Fothergill visited the group to discuss various categories of drugs, their effects and withdrawal symptoms, medications prescribed to address cravings and other complications along with prescribed medications for various diagnoses and mental illnesses. There were many questions and comments among the group and they stated the session proved very informative.  Group Time: 2:45-4pm  Participation Level: Active  Behavioral Response: Sharing  Type of Therapy: Process Group  Topic: Group process: members discussed current issues and concerns in early recovery. One member expressed frustration with the difficulties and challenges in early recovery. Another expressed frustration with his sponsor. Group members provided good feedback and support to their fellow group members. Some drug tests were collected while others were returned from the previous collection.   Summary: The patient was attentive and asked good questions during the pharmacist's visit. In process, he shared that his new sponsor had completed bailed on him for two consecutive scheduled meetings. He admitted he was furious and didn't know what to do.he provided good support to the member struggling with his feelings and issues and admitted he felt the same way.the patient made some good comments and opened up a part of himself to the group. His sobriety date remains 8/16.   Family Program: Family present? {BHH YES OR NO:22294}   Name of family member(s): ***  UDS collected: {BHH YES OR NO:22294} Results: {Findings; urine drug screen:60936} AA/NA attended?: {BHH YES OR NO:22294}{DAYS OF ZOXW:96045}  Sponsor?:  {BHH YES OR NO:22294}   Kamoni Gentles, LCAS

## 2013-07-05 ENCOUNTER — Other Ambulatory Visit (HOSPITAL_COMMUNITY): Payer: BC Managed Care – PPO

## 2013-07-08 ENCOUNTER — Other Ambulatory Visit (HOSPITAL_COMMUNITY): Payer: BC Managed Care – PPO | Admitting: Psychology

## 2013-07-08 DIAGNOSIS — F1020 Alcohol dependence, uncomplicated: Secondary | ICD-10-CM

## 2013-07-08 DIAGNOSIS — F331 Major depressive disorder, recurrent, moderate: Secondary | ICD-10-CM

## 2013-07-08 NOTE — Progress Notes (Unsigned)
Patient ID: Kristopher [name], male   DOB: 1951/07/03, 62 y.o.   MRN: [id number] CD-IOP: The patient was excused from group today. He had informed me long ago of a trip to the beach he had scheduled with some close friends. They are scheduled to go fishing. He had explained that his friends all know that he is in treatment and they won't drink or support any by him. The trip should be fun and supportive. He is excused from today, but should return on Monday for group.

## 2013-07-10 ENCOUNTER — Encounter (HOSPITAL_COMMUNITY): Payer: Self-pay | Admitting: Psychology

## 2013-07-10 ENCOUNTER — Other Ambulatory Visit (HOSPITAL_COMMUNITY): Payer: BC Managed Care – PPO | Attending: Psychiatry | Admitting: Psychology

## 2013-07-10 DIAGNOSIS — F331 Major depressive disorder, recurrent, moderate: Secondary | ICD-10-CM

## 2013-07-10 DIAGNOSIS — F1020 Alcohol dependence, uncomplicated: Secondary | ICD-10-CM | POA: Insufficient documentation

## 2013-07-10 NOTE — Progress Notes (Signed)
    Daily Group Progress Note  Program: CD-IOP   Group Time: 1-2:30 pm  Participation Level: Active  Behavioral Response: Appropriate and Sharing  Type of Therapy: Process Group  Topic: Group Process; the first part of group was spent in process. Members shared about the past weekend and any issues or concerns in early recovery. One member admitted taking Ativan on Saturday and Sunday because of a non-stop migraine. Another group member reported he had taken some sleep medication because of his restless leg syndrome was keeping him from sleeping.  Another member reported he had secured a temporary sponsor and was supposed to meet him at his home tonight and then attend an AA meeting together. There was a good discussion and feedback provided among group members.   Group Time: 2:45-4pm  Participation Level: Minimal  Behavioral Response: Sharing  Type of Therapy: Psycho-education Group  Topic: Communication Styles: Which One Are You? A psycho-ed session followed the break. A handout on communication was provided and members identified and discussed Four Communication Styles, including Aggressive, Passive, Passive-Aggressive, and Assertive. A discussion identifying the characteristics of each style and how they differ continued with members identifying the style they most displayed. The importance of being open, honest, and asking for what you need was emphasized. There was good feedback with most members agreeing they were passive-aggressive.    Summary: The patient had missed group on Friday because of a fishing trip scheduled long ago. His wife had confirmed the plans and had noted that the friend was supportive and aware of Rod's current issues about sobriety. The patient shared that he had had an event occur that had been very frustrating and he reported it was a 'resentment'. He described the episode and it had 'pushed his button' about the judgmental nature of organized religion. It  had really bothered him and his fellow group members quickly embraced his feelings about the experience. The patient received good validation and feedback from the group. At one point, in the second half of group, I called on this group member noting his lack of engagement. He made some sort of excuse and the group defended him. He reported his sobriety date was 8/16.   Family Program: Family present? No   Name of family member(s):   UDS collected: Yes, a test was given to him, but he left it under his chair and did not provide a sample. He did not return despite repeated phone calls asking him to come back to the clinic. It will be regarded as a refusal and a positive drug test  AA/NA attended?: YesSaturday and Sunday  Sponsor?: Yes   Aeris Hersman, LCAS

## 2013-07-12 ENCOUNTER — Other Ambulatory Visit (HOSPITAL_COMMUNITY): Payer: BC Managed Care – PPO | Admitting: Psychology

## 2013-07-12 ENCOUNTER — Other Ambulatory Visit (HOSPITAL_COMMUNITY): Payer: Self-pay | Admitting: Physician Assistant

## 2013-07-12 DIAGNOSIS — F1020 Alcohol dependence, uncomplicated: Secondary | ICD-10-CM

## 2013-07-12 DIAGNOSIS — F331 Major depressive disorder, recurrent, moderate: Secondary | ICD-10-CM

## 2013-07-12 MED ORDER — TRAZODONE HCL 100 MG PO TABS: 100.0000 mg | ORAL_TABLET | Freq: Every day | ORAL | Status: AC

## 2013-07-12 NOTE — Telephone Encounter (Signed)
Rod reported he had resumed taking the trazodone as he found he continued to have headaches even after he stopped taking it, therefore trazodone was not the cause of the headaches. He has been taking 100 mg at bedtime, and is requesting a refill. Trazodone 100 mg one at bedtime prescription sent to his pharmacy.

## 2013-07-15 ENCOUNTER — Encounter (HOSPITAL_COMMUNITY): Payer: Self-pay | Admitting: Psychology

## 2013-07-15 ENCOUNTER — Other Ambulatory Visit (HOSPITAL_COMMUNITY): Payer: BC Managed Care – PPO | Admitting: Psychology

## 2013-07-15 DIAGNOSIS — F1020 Alcohol dependence, uncomplicated: Secondary | ICD-10-CM

## 2013-07-15 DIAGNOSIS — F331 Major depressive disorder, recurrent, moderate: Secondary | ICD-10-CM

## 2013-07-15 NOTE — Progress Notes (Signed)
    Daily Group Progress Note  Program: CD-IOP   Group Time: 1-2:30 pm  Participation Level: Active  Behavioral Response: Appropriate and Sharing  Type of Therapy: Process Group  Topic: Group Process: the first p-art of group was spent in process. Members shared about current issues and concerns. One member who had been impaired on Wednesday appeared and he apologized to his fellow group members. A new group member was present and he shared a little about himself and what he was needing from the group. During this session, the medical director, AW, saw the new patient and met with members about current medications, amounts, and any problems that they might be experiencing.   Group Time: 2:45- 4pm  Participation Level: Minimal  Behavioral Response: Distant and withdrawn  Type of Therapy: Psycho-education Group  Topic: Resentments: The second half of group was spent in a presentation on resentments. The damage done by holding onto resentments was discussed. It included a handout and discussion about what benefits come with resentments and why people might hold onto them. Members shared about realizations about what they have been holding onto and admitted there were benefits to those resentments. There was good insight among the group and feedback proved very effective. The session went very well with good disclosure among the group members.    Summary: The patient arrived late for group today. When he checked-in, he apologized to his fellow group members for having been impaired during the last session. He explained that he had been under a lot of pressure and was very upset by a number of different problems at work. I noted that he had not shared any of this with the group over the past few weeks. The importance of talking about problems was emphasized to all of the group members. He agreed that he should have talked more. The patient was quiet, but attentive for the reminder of the  session. The patient's new sobriety date is 10/2.   Family Program: Family present? No   Name of family member(s):   UDS collected: No Results:   AA/NA attended?: YesFriday, Saturday and Sunday  Sponsor?: Yes   Aliza Moret, LCAS

## 2013-07-16 ENCOUNTER — Encounter (HOSPITAL_COMMUNITY): Payer: Self-pay | Admitting: Psychology

## 2013-07-16 NOTE — Progress Notes (Unsigned)
    Daily Group Progress Note  Program: {CHL AMB BH IOP/CDIOP Program Type:21022744}   Group Time: ***  Participation Level: {CHL AMB BH Group Participation:21022742}  Behavioral Response: {CHL AMB BH Group Behavior:21022743}  Type of Therapy: {CHL AMB BH Type of Therapy:21022741}  Topic: ***     Group Time: ***  Participation Level: {CHL AMB BH Group Participation:21022742}  Behavioral Response: {CHL AMB BH Group Behavior:21022743}  Type of Therapy: {CHL AMB BH Type of Therapy:21022741}  Topic: ***   Summary: ***   Family Program: Family present? {BHH YES OR NO:22294}   Name of family member(s): ***  UDS collected: {BHH YES OR NO:22294} Results: {Findings; urine drug screen:60936}  AA/NA attended?: {BHH YES OR NO:22294}{DAYS OF WEEK:22385}  Sponsor?: {BHH YES OR NO:22294}   Selby Slovacek, LCAS        

## 2013-07-17 ENCOUNTER — Other Ambulatory Visit (HOSPITAL_COMMUNITY): Payer: BC Managed Care – PPO | Admitting: Psychology

## 2013-07-17 ENCOUNTER — Encounter (HOSPITAL_COMMUNITY): Payer: Self-pay | Admitting: Psychology

## 2013-07-17 DIAGNOSIS — F1020 Alcohol dependence, uncomplicated: Secondary | ICD-10-CM

## 2013-07-17 DIAGNOSIS — F331 Major depressive disorder, recurrent, moderate: Secondary | ICD-10-CM

## 2013-07-19 ENCOUNTER — Other Ambulatory Visit: Payer: Self-pay | Admitting: *Deleted

## 2013-07-19 ENCOUNTER — Other Ambulatory Visit (HOSPITAL_COMMUNITY): Payer: BC Managed Care – PPO

## 2013-07-19 MED ORDER — POTASSIUM CHLORIDE CRYS ER 20 MEQ PO TBCR: EXTENDED_RELEASE_TABLET | ORAL | Status: DC

## 2013-07-20 NOTE — Progress Notes (Unsigned)
Patient ID: Kristopher [name], male   DOB: [birth year], 62 y.o.   MRN: [id number] Discharge from CD-IOP. Patient was pulled out of group during the break when other members noted he appeared impaired. The patient denied using anything, but blew a .13. This represented the second positive BAC in one week. His wife was contacted and she drove to [facility] and took him home. He agreed to sign a consent for [facility] and he was encouraged to enter residential treatment as it had become clear that the patient needs a higher level of care. Documentation was completed and the patient discharged from the program.

## 2013-07-21 ENCOUNTER — Encounter (HOSPITAL_COMMUNITY): Payer: Self-pay | Admitting: Psychology

## 2013-07-21 NOTE — Progress Notes (Signed)
    Daily Group Progress Note  Program: CD-IOP   Group Time: 1-2:45  Participation Level: Minimal  Behavioral Response: Sharing, Resistant and Suspicious  Type of Therapy: Process Group  Topic: Group Process: the first part of group was spent in process. Members shared about challenges or struggles they are dealing with in early recovery. One member did not present as the group as known him and they continued to express concerns about him. Another member took a bottle of pills out of his pocket and was immediately challenged by members. The session proved very difficult, with continued denial and frustration, but it provided a very different perspective for group members who were seeing things from the other side - as one looking as the addict is using.   Group Time: 2:45- 4pm  Participation Level: None  Behavioral Response: removed from group  Type of Therapy: Psycho-education Group  Topic: Communication Styles: Part II. The second half of group was spent in an ongoing presentation on communication. But prior to the psycho-ed, the group shared their feelings about the group member who had presented as impaired. Despite denying any use, a BAC collected at the break had been positive for alcohol. His car keys had been handed over and a call to his wife made. The group expressed frustration, anger, and disappointment at the impaired member. They also noted how upsetting it was to be sober and watch someone else who is very high - and denying it. The presentation continued and explored the topic discussed initially during the last group session. The 'benefits' of passivity and passive-aggressive communication styles were discussed at length. Members made good comments and the session proved effective.   Summary: The patient arrived a little late to group. He apologized to the group and the session continued. He checked-in with a sobriety date of 8/16. As the session continued, I noted that he  seemed very sleepy and wondered if something was wrong? He reported he was fine, but admitted he had woken up early - somewhere around 4 am and - had not gotten back to sleep. The session continued and another member confronted Rod and expressed concern because he was not himself and seemed to be impaired. Other members agreed with this observation. He denied any alcohol use, but admitted he had used mouthwash around 10 am. The security was called and he was escorted from the group room and a BAC was collected. The BAC registered a 2.1. The patient remained in another room while the group continued. His wife was contacted and she drove to [facility] to pick him up. (Please see note in Epic).  Family Program: Family present? No   Name of family member(s):   UDS collected: No Results: but BAC collected by Security was positive with .21  AA/NA attended?: [place]  Sponsor?: No   Devri Kreher, LCAS

## 2013-07-21 NOTE — Progress Notes (Signed)
    Daily Group Progress Note  Program: CD-IOP   Group Time: 1-2:30 pm  Participation Level: Minimal  Behavioral Response: Sharing  Type of Therapy: Process Group  Topic: Group Process: the first part of group was spent in process. A fellow therapist, HB, appeared during this session. She shared about her experiences attending Al-Anon meetings and how much she has benefitted from them.  This therapist emphasized the importance of separating the "Addiction" from the "Person". She explained that the purpose of group, in part, is to point out any addictive behaviors, attitudes, and thoughts other group members may be displaying. Frequently, the addict is the last one to see what others can see clearly. We must help each other become more conscious and aware of how they present. There was good discussion and feedback among group members.  Group Time: 2:45- 4pm  Participation Level: Active  Behavioral Response: Sharing, Grandiose and Suspicious  Type of Therapy: Psycho-education Group  Topic: Chaplain: second half of group was spent in a visit with one of the [employer]. He brought with him some quotes and a discussion on Forgiveness. Members shared their views on various quotes and provided their own explanations about forgiveness. There was a good disclosure among members and they responded very well to his visit and the intervention. At the break, it was recognized that one of the members appeared impaired. A BAC was collected and he was positive with a 1.3. His keys were collected and his wife contacted to drive to the clinic. The group shared their feelings about this event - the second in a week for the same member.   Summary: The patient arrived late and apologized to the group. He sat in a different place than he had previously, but the other seats were taken. He was very engaged in the session with the chaplain and seemed to repeat himself as he shared about  his work and his wife. At the brief break after the chaplain had left, I was informed by some group members that Rod appeared impaired. When I called him out, the patient denied he had been drinking. He agreed to take a BAC, but when one was collected, it was positive with a .13 reading. His keys were collected and his wife phoned. The patient sat in another room for the remainder of the session.    Family Program: Family present? No   Name of family member(s):   UDS collected: No, but a BAC was Results:.13  AA/NA attended?: [place]  Sponsor?: Yes   Evyn Kooyman, LCAS

## 2013-07-22 ENCOUNTER — Other Ambulatory Visit (HOSPITAL_COMMUNITY): Payer: BC Managed Care – PPO

## 2013-07-24 ENCOUNTER — Other Ambulatory Visit (HOSPITAL_COMMUNITY): Payer: BC Managed Care – PPO

## 2013-07-26 ENCOUNTER — Other Ambulatory Visit (HOSPITAL_COMMUNITY): Payer: BC Managed Care – PPO

## 2013-07-29 ENCOUNTER — Other Ambulatory Visit (HOSPITAL_COMMUNITY): Payer: BC Managed Care – PPO

## 2013-07-31 ENCOUNTER — Other Ambulatory Visit (HOSPITAL_COMMUNITY): Payer: BC Managed Care – PPO

## 2013-08-02 ENCOUNTER — Other Ambulatory Visit (HOSPITAL_COMMUNITY): Payer: BC Managed Care – PPO

## 2013-08-05 ENCOUNTER — Other Ambulatory Visit (HOSPITAL_COMMUNITY): Payer: BC Managed Care – PPO

## 2013-08-07 ENCOUNTER — Other Ambulatory Visit (HOSPITAL_COMMUNITY): Payer: BC Managed Care – PPO

## 2013-08-09 ENCOUNTER — Other Ambulatory Visit (HOSPITAL_COMMUNITY): Payer: BC Managed Care – PPO

## 2013-08-15 ENCOUNTER — Other Ambulatory Visit: Payer: Self-pay

## 2013-09-07 ENCOUNTER — Other Ambulatory Visit: Payer: Self-pay | Admitting: Cardiology

## 2013-11-02 ENCOUNTER — Other Ambulatory Visit: Payer: Self-pay | Admitting: Cardiology

## 2014-01-01 ENCOUNTER — Other Ambulatory Visit: Payer: Self-pay | Admitting: *Deleted

## 2014-01-01 MED ORDER — CARVEDILOL 25 MG PO TABS: ORAL_TABLET | ORAL | Status: DC

## 2014-04-08 DIAGNOSIS — R7303 Prediabetes: Secondary | ICD-10-CM | POA: Insufficient documentation

## 2014-07-25 ENCOUNTER — Other Ambulatory Visit: Payer: Self-pay

## 2014-07-25 NOTE — Telephone Encounter (Signed)
Nothing was typed/tmj 

## 2015-04-06 ENCOUNTER — Other Ambulatory Visit: Payer: Self-pay

## 2015-12-02 DIAGNOSIS — G8929 Other chronic pain: Secondary | ICD-10-CM | POA: Insufficient documentation

## 2015-12-02 DIAGNOSIS — G2581 Restless legs syndrome: Secondary | ICD-10-CM | POA: Insufficient documentation

## 2015-12-02 DIAGNOSIS — E7800 Pure hypercholesterolemia, unspecified: Secondary | ICD-10-CM | POA: Insufficient documentation

## 2016-03-18 DIAGNOSIS — I872 Venous insufficiency (chronic) (peripheral): Secondary | ICD-10-CM | POA: Insufficient documentation

## 2016-08-07 DIAGNOSIS — N529 Male erectile dysfunction, unspecified: Secondary | ICD-10-CM | POA: Insufficient documentation

## 2016-08-07 DIAGNOSIS — F3340 Major depressive disorder, recurrent, in remission, unspecified: Secondary | ICD-10-CM | POA: Insufficient documentation

## 2016-10-17 ENCOUNTER — Other Ambulatory Visit: Payer: Self-pay | Admitting: Orthopedic Surgery

## 2016-10-17 DIAGNOSIS — M545 Low back pain, unspecified: Secondary | ICD-10-CM

## 2016-10-24 ENCOUNTER — Other Ambulatory Visit: Payer: Self-pay | Admitting: Orthopedic Surgery

## 2016-10-24 ENCOUNTER — Ambulatory Visit
Admission: RE | Admit: 2016-10-24 | Discharge: 2016-10-24 | Disposition: A | Discharge status: Home / Self Care | Payer: Self-pay | Source: Ambulatory Visit | Attending: Orthopedic Surgery | Admitting: Orthopedic Surgery

## 2016-10-24 DIAGNOSIS — M545 Low back pain, unspecified: Secondary | ICD-10-CM

## 2016-10-25 ENCOUNTER — Ambulatory Visit
Admission: RE | Admit: 2016-10-25 | Discharge: 2016-10-25 | Disposition: A | Discharge status: Home / Self Care | Payer: Medicare Other | Source: Ambulatory Visit | Attending: Orthopedic Surgery | Admitting: Orthopedic Surgery

## 2016-10-25 DIAGNOSIS — M545 Low back pain, unspecified: Secondary | ICD-10-CM

## 2016-10-25 MED ORDER — DIAZEPAM 5 MG PO TABS
5.0000 mg | ORAL_TABLET | Freq: Once | ORAL | Status: AC
  Administered 2016-10-25: 10 mg via ORAL

## 2016-10-25 MED ORDER — ONDANSETRON HCL 4 MG/2ML IJ SOLN
4.0000 mg | Freq: Once | INTRAMUSCULAR | Status: AC
  Administered 2016-10-25: 4 mg via INTRAMUSCULAR

## 2016-10-25 MED ORDER — MEPERIDINE HCL 100 MG/ML IJ SOLN
100.0000 mg | Freq: Once | INTRAMUSCULAR | Status: AC
  Administered 2016-10-25: 100 mg via INTRAMUSCULAR

## 2016-10-25 MED ORDER — IOPAMIDOL (ISOVUE-M 200) INJECTION 41%
15.0000 mL | Freq: Once | INTRAMUSCULAR | Status: AC
  Administered 2016-10-25: 15 mL via INTRATHECAL

## 2016-10-25 NOTE — Discharge Instructions (Signed)
Myelogram Discharge Instructions  1. Go home and rest quietly for the next 24 hours.  It is important to lie flat for the next 24 hours.  Get up only to go to the restroom.  You may lie in the bed or on a couch on your back, your stomach, your left side or your right side.  You may have one pillow under your head.  You may have pillows between your knees while you are on your side or under your knees while you are on your back.  2. DO NOT drive today.  Recline the seat as far back as it will go, while still wearing your seat belt, on the way home.  3. You may get up to go to the bathroom as needed.  You may sit up for 10 minutes to eat.  You may resume your normal diet and medications unless otherwise indicated.  Drink plenty of extra fluids today and tomorrow.  4. The incidence of a spinal headache with nausea and/or vomiting is about 5% (one in 20 patients).  If you develop a headache, lie flat and drink plenty of fluids until the headache goes away.  Caffeinated beverages may be helpful.  If you develop severe nausea and vomiting or a headache that does not go away with flat bed rest, call [phone].  5. You may resume normal activities after your 24 hours of bed rest is over; however, do not exert yourself strongly or do any heavy lifting tomorrow.  6. Call your physician for a follow-up appointment.    You may resume Cymbalta and Trazodone on Wednesday, October 26, 2016 after 10:30a.m.

## 2016-10-25 NOTE — Progress Notes (Signed)
Patient states he has been off Cymbalta and Trazodone for at least the past two days.  jkl

## 2017-01-08 DIAGNOSIS — E559 Vitamin D deficiency, unspecified: Secondary | ICD-10-CM | POA: Insufficient documentation

## 2017-05-21 DIAGNOSIS — M5136 Other intervertebral disc degeneration, lumbar region: Secondary | ICD-10-CM | POA: Insufficient documentation

## 2017-05-21 DIAGNOSIS — M51369 Other intervertebral disc degeneration, lumbar region without mention of lumbar back pain or lower extremity pain: Secondary | ICD-10-CM | POA: Insufficient documentation

## 2017-05-24 NOTE — Progress Notes (Signed)
Please place orders in EPIC as patient is being scheduled for a pre-op appointment! Thank you! 

## 2017-06-01 ENCOUNTER — Encounter (HOSPITAL_COMMUNITY): Payer: Self-pay

## 2017-06-01 ENCOUNTER — Other Ambulatory Visit (HOSPITAL_COMMUNITY): Payer: Self-pay | Admitting: Emergency Medicine

## 2017-06-01 NOTE — Patient Instructions (Addendum)
Kristopher [name]  06/01/2017   Your procedure is scheduled on: 06-07-17  Report to [facility] Main  Entrance Take [place]  elevators to 3rd floor to  [place] [facility] [place].    Call this number if you have problems the morning of surgery [phone]    Remember: ONLY 1 PERSON MAY GO WITH YOU TO SHORT STAY TO GET  READY MORNING OF [place].  Do not eat food or drink liquids :After Midnight.     Take these medicines the morning of surgery with A SIP OF WATER: tylenol as needed, gabapentin, labetalol, tramadol as needed               PLEASE BRING CPAP MASK AND TUBING. THE CPAP DEVICE WILL BE PROVIDED FOR YOU!              You may not have any metal on your body including hair pins and              piercings  Do not wear jewelry, make-up, lotions, powders or perfumes, deodorant              Men may shave face and neck.   Do not bring valuables to the hospital. [place].  Contacts, dentures or bridgework may not be worn into surgery.  Leave suitcase in the car. After surgery it may be brought to your room.                Please read over the following fact sheets you were given: _____________________________________________________________________             [facility] - Preparing for Surgery Before surgery, you can play an important role.  Because skin is not sterile, your skin needs to be as free of germs as possible.  You can reduce the number of germs on your skin by washing with CHG (chlorahexidine gluconate) soap before surgery.  CHG is an antiseptic cleaner which kills germs and bonds with the skin to continue killing germs even after washing. Please DO NOT use if you have an allergy to CHG or antibacterial soaps.  If your skin becomes reddened/irritated stop using the CHG and inform your nurse when you arrive at Short Stay. Do not shave (including legs and underarms) for at least 48 hours prior to  the first CHG shower.  You may shave your face/neck. Please follow these instructions carefully:  1.  Shower with CHG Soap the night before surgery and the  morning of Surgery.  2.  If you choose to wash your hair, wash your hair first as usual with your  normal  shampoo.  3.  After you shampoo, rinse your hair and body thoroughly to remove the  shampoo.                           4.  Use CHG as you would any other liquid soap.  You can apply chg directly  to the skin and wash                       Gently with a scrungie or clean washcloth.  5.  Apply the CHG Soap to your body ONLY FROM THE NECK DOWN.  Do not use on face/ open                           Wound or open sores. Avoid contact with eyes, ears mouth and genitals (private parts).                       Wash face,  Genitals (private parts) with your normal soap.             6.  Wash thoroughly, paying special attention to the area where your surgery  will be performed.  7.  Thoroughly rinse your body with warm water from the neck down.  8.  DO NOT shower/wash with your normal soap after using and rinsing off  the CHG Soap.                9.  Pat yourself dry with a clean towel.            10.  Wear clean pajamas.            11.  Place clean sheets on your bed the night of your first shower and do not  sleep with pets. Day of Surgery : Do not apply any lotions/deodorants the morning of surgery.  Please wear clean clothes to the hospital/surgery center.  FAILURE TO FOLLOW THESE INSTRUCTIONS MAY RESULT IN THE CANCELLATION OF YOUR SURGERY PATIENT SIGNATURE_________________________________  NURSE SIGNATURE__________________________________  ________________________________________________________________________   Kristopher [name]  An incentive spirometer is a tool that can help keep your lungs clear and active. This tool measures how well you are filling your lungs with each breath. Taking long deep breaths may help reverse or  decrease the chance of developing breathing (pulmonary) problems (especially infection) following:  A long period of time when you are unable to move or be active. BEFORE THE PROCEDURE   If the spirometer includes an indicator to show your best effort, your nurse or respiratory therapist will set it to a desired goal.  If possible, sit up straight or lean slightly forward. Try not to slouch.  Hold the incentive spirometer in an upright position. INSTRUCTIONS FOR USE  1. Sit on the edge of your bed if possible, or sit up as far as you can in bed or on a chair. 2. Hold the incentive spirometer in an upright position. 3. Breathe out normally. 4. Place the mouthpiece in your mouth and seal your lips tightly around it. 5. Breathe in slowly and as deeply as possible, raising the piston or the ball toward the top of the column. 6. Hold your breath for 3-5 seconds or for as long as possible. Allow the piston or ball to fall to the bottom of the column. 7. Remove the mouthpiece from your mouth and breathe out normally. 8. Rest for a few seconds and repeat Steps 1 through 7 at least 10 times every 1-2 hours when you are awake. Take your time and take a few normal breaths between deep breaths. 9. The spirometer may include an indicator to show your best effort. Use the indicator as a goal to work toward during each repetition. 10. After each set of 10 deep breaths, practice coughing to be sure your lungs are clear. If you have an incision (the cut made at the time of surgery), support your incision when coughing by placing a pillow or rolled up towels firmly against it. Once you are able to get out of  bed, walk around indoors and cough well. You may stop using the incentive spirometer when instructed by your caregiver.  RISKS AND COMPLICATIONS  Take your time so you do not get dizzy or light-headed.  If you are in pain, you may need to take or ask for pain medication before doing incentive spirometry.  It is harder to take a deep breath if you are having pain. AFTER USE  Rest and breathe slowly and easily.  It can be helpful to keep track of a log of your progress. Your caregiver can provide you with a simple table to help with this. If you are using the spirometer at home, follow these instructions: [place] IF:   You are having difficultly using the spirometer.  You have trouble using the spirometer as often as instructed.  Your pain medication is not giving enough relief while using the spirometer.  You develop fever of 100.5 F (38.1 C) or higher. SEEK IMMEDIATE MEDICAL CARE IF:   You cough up bloody sputum that had not been present before.  You develop fever of 102 F (38.9 C) or greater.  You develop worsening pain at or near the incision site. MAKE SURE YOU:   Understand these instructions.  Will watch your condition.  Will get help right away if you are not doing well or get worse. Document Released: 02/06/2007 Document Revised: 12/19/2011 Document Reviewed: 04/09/2007 [facility] Patient Information 2014 [place], [place].   ________________________________________________________________________

## 2017-06-01 NOTE — Progress Notes (Signed)
Clearance Dr [facility] 05-22-17 on chart   LOV Dr Garlon Hatchet 05-22-17 on chart   CMP, HgA1c on chart with Dr Garlon Hatchet progress notes 05-17-17

## 2017-06-02 ENCOUNTER — Encounter (HOSPITAL_COMMUNITY): Payer: Medicare Other

## 2017-06-02 ENCOUNTER — Encounter (HOSPITAL_COMMUNITY): Payer: Self-pay

## 2017-06-02 ENCOUNTER — Ambulatory Visit (HOSPITAL_COMMUNITY)
Admission: RE | Admit: 2017-06-02 | Discharge: 2017-06-02 | Disposition: A | Discharge status: Home / Self Care | Payer: Medicare Other | Source: Ambulatory Visit | Attending: Surgical | Admitting: Surgical

## 2017-06-02 ENCOUNTER — Encounter (HOSPITAL_COMMUNITY)
Admission: RE | Admit: 2017-06-02 | Discharge: 2017-06-02 | Disposition: A | Discharge status: Home / Self Care | Payer: Medicare Other | Source: Ambulatory Visit | Attending: Orthopedic Surgery | Admitting: Orthopedic Surgery

## 2017-06-02 DIAGNOSIS — I10 Essential (primary) hypertension: Secondary | ICD-10-CM | POA: Insufficient documentation

## 2017-06-02 DIAGNOSIS — Z01810 Encounter for preprocedural cardiovascular examination: Secondary | ICD-10-CM | POA: Diagnosis not present

## 2017-06-02 DIAGNOSIS — R5381 Other malaise: Secondary | ICD-10-CM | POA: Diagnosis not present

## 2017-06-02 DIAGNOSIS — Z01818 Encounter for other preprocedural examination: Secondary | ICD-10-CM | POA: Diagnosis not present

## 2017-06-02 DIAGNOSIS — G4733 Obstructive sleep apnea (adult) (pediatric): Secondary | ICD-10-CM | POA: Insufficient documentation

## 2017-06-02 DIAGNOSIS — E785 Hyperlipidemia, unspecified: Secondary | ICD-10-CM | POA: Insufficient documentation

## 2017-06-02 DIAGNOSIS — M545 Low back pain, unspecified: Secondary | ICD-10-CM

## 2017-06-02 DIAGNOSIS — R001 Bradycardia, unspecified: Secondary | ICD-10-CM | POA: Diagnosis not present

## 2017-06-02 DIAGNOSIS — M48061 Spinal stenosis, lumbar region without neurogenic claudication: Secondary | ICD-10-CM | POA: Diagnosis not present

## 2017-06-02 DIAGNOSIS — F1120 Opioid dependence, uncomplicated: Secondary | ICD-10-CM | POA: Diagnosis not present

## 2017-06-02 DIAGNOSIS — M7989 Other specified soft tissue disorders: Secondary | ICD-10-CM | POA: Insufficient documentation

## 2017-06-02 DIAGNOSIS — F10230 Alcohol dependence with withdrawal, uncomplicated: Secondary | ICD-10-CM | POA: Insufficient documentation

## 2017-06-02 DIAGNOSIS — Z01812 Encounter for preprocedural laboratory examination: Secondary | ICD-10-CM | POA: Diagnosis present

## 2017-06-02 HISTORY — DX: Sleep apnea, unspecified: G47.30

## 2017-06-02 LAB — CBC WITH DIFFERENTIAL/PLATELET
Basophils Absolute: 0 10*3/uL (ref 0.0–0.1)
Basophils Relative: 0 %
Eosinophils Absolute: 0.2 10*3/uL (ref 0.0–0.7)
Eosinophils Relative: 2 %
HCT: 41 % (ref 39.0–52.0)
Hemoglobin: 13.7 g/dL (ref 13.0–17.0)
Lymphocytes Relative: 26 %
Lymphs Abs: 1.8 10*3/uL (ref 0.7–4.0)
MCH: 31.1 pg (ref 26.0–34.0)
MCHC: 33.4 g/dL (ref 30.0–36.0)
MCV: 93 fL (ref 78.0–100.0)
Monocytes Absolute: 0.7 10*3/uL (ref 0.1–1.0)
Monocytes Relative: 11 %
Neutro Abs: 4.1 10*3/uL (ref 1.7–7.7)
Neutrophils Relative %: 61 %
Platelets: 202 10*3/uL (ref 150–400)
RBC: 4.41 MIL/uL (ref 4.22–5.81)
RDW: 13.2 % (ref 11.5–15.5)
WBC: 6.8 10*3/uL (ref 4.0–10.5)

## 2017-06-02 LAB — URINALYSIS, ROUTINE W REFLEX MICROSCOPIC
Bacteria, UA: NONE SEEN
Bilirubin Urine: NEGATIVE
Glucose, UA: NEGATIVE mg/dL
Hgb urine dipstick: NEGATIVE
Ketones, ur: NEGATIVE mg/dL
Leukocytes, UA: NEGATIVE
Nitrite: NEGATIVE
Protein, ur: NEGATIVE mg/dL
Specific Gravity, Urine: >1.03 — ABNORMAL HIGH (ref 1.005–1.030)
Squamous Epithelial / LPF: NONE SEEN
pH: 5.5 (ref 5.0–8.0)

## 2017-06-02 LAB — PROTIME-INR
INR: 1.02
Prothrombin Time: 13.4 seconds (ref 11.4–15.2)

## 2017-06-02 LAB — APTT: aPTT: 27 seconds (ref 24–36)

## 2017-06-02 LAB — SURGICAL PCR SCREEN
MRSA, PCR: POSITIVE — AB
Staphylococcus aureus: POSITIVE — AB

## 2017-06-06 NOTE — Progress Notes (Signed)
Called Dr Gladstone Lighter regarding Antibiotic orders for scheduled OR 06/07/2017-- positive MRSA PCR.  Amber PA will place new order.
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
## 2017-06-07 ENCOUNTER — Encounter (HOSPITAL_COMMUNITY): Payer: Self-pay | Admitting: Anesthesiology

## 2017-06-07 ENCOUNTER — Encounter (HOSPITAL_COMMUNITY)
Admission: RE | Disposition: A | Discharge status: Home / Self Care | Payer: Self-pay | Source: Ambulatory Visit | Attending: Orthopedic Surgery

## 2017-06-07 ENCOUNTER — Ambulatory Visit (HOSPITAL_COMMUNITY): Payer: Medicare Other | Admitting: Anesthesiology

## 2017-06-07 ENCOUNTER — Observation Stay (HOSPITAL_COMMUNITY)
Admission: RE | Admit: 2017-06-07 | Discharge: 2017-06-08 | Disposition: A | Discharge status: Home / Self Care | Payer: Medicare Other | Source: Ambulatory Visit | Attending: Orthopedic Surgery | Admitting: Orthopedic Surgery

## 2017-06-07 ENCOUNTER — Ambulatory Visit (HOSPITAL_COMMUNITY): Payer: Medicare Other

## 2017-06-07 DIAGNOSIS — M48062 Spinal stenosis, lumbar region with neurogenic claudication: Principal | ICD-10-CM | POA: Diagnosis present

## 2017-06-07 DIAGNOSIS — Z9989 Dependence on other enabling machines and devices: Secondary | ICD-10-CM | POA: Insufficient documentation

## 2017-06-07 DIAGNOSIS — G4700 Insomnia, unspecified: Secondary | ICD-10-CM | POA: Diagnosis not present

## 2017-06-07 DIAGNOSIS — Z79899 Other long term (current) drug therapy: Secondary | ICD-10-CM | POA: Diagnosis not present

## 2017-06-07 DIAGNOSIS — G2581 Restless legs syndrome: Secondary | ICD-10-CM | POA: Diagnosis not present

## 2017-06-07 DIAGNOSIS — M48061 Spinal stenosis, lumbar region without neurogenic claudication: Secondary | ICD-10-CM | POA: Diagnosis present

## 2017-06-07 DIAGNOSIS — E785 Hyperlipidemia, unspecified: Secondary | ICD-10-CM | POA: Diagnosis not present

## 2017-06-07 DIAGNOSIS — I10 Essential (primary) hypertension: Secondary | ICD-10-CM | POA: Insufficient documentation

## 2017-06-07 DIAGNOSIS — Z419 Encounter for procedure for purposes other than remedying health state, unspecified: Secondary | ICD-10-CM

## 2017-06-07 DIAGNOSIS — G4730 Sleep apnea, unspecified: Secondary | ICD-10-CM | POA: Insufficient documentation

## 2017-06-07 HISTORY — PX: LUMBAR LAMINECTOMY/DECOMPRESSION MICRODISCECTOMY: SHX5026

## 2017-06-07 LAB — COMPREHENSIVE METABOLIC PANEL
ALT: 35 U/L (ref 17–63)
AST: 26 U/L (ref 15–41)
Albumin: 4.4 g/dL (ref 3.5–5.0)
Alkaline Phosphatase: 72 U/L (ref 38–126)
Anion gap: 9 (ref 5–15)
BUN: 22 mg/dL — ABNORMAL HIGH (ref 6–20)
CO2: 27 mmol/L (ref 22–32)
Calcium: 9.2 mg/dL (ref 8.9–10.3)
Chloride: 105 mmol/L (ref 101–111)
Creatinine, Ser: 1.27 mg/dL — ABNORMAL HIGH (ref 0.61–1.24)
GFR calc Af Amer: >60 mL/min (ref >60)
GFR calc non Af Amer: 57 mL/min — ABNORMAL LOW (ref >60)
Glucose, Bld: 131 mg/dL — ABNORMAL HIGH (ref 65–99)
Potassium: 3.5 mmol/L (ref 3.5–5.1)
Sodium: 141 mmol/L (ref 135–145)
Total Bilirubin: 0.8 mg/dL (ref 0.3–1.2)
Total Protein: 7.1 g/dL (ref 6.5–8.1)

## 2017-06-07 SURGERY — LUMBAR LAMINECTOMY/DECOMPRESSION MICRODISCECTOMY
Anesthesia: General

## 2017-06-07 MED ORDER — ROCURONIUM BROMIDE 50 MG/5ML IV SOSY
PREFILLED_SYRINGE | INTRAVENOUS | Status: AC
  Filled 2017-06-07: qty 5

## 2017-06-07 MED ORDER — SODIUM CHLORIDE 0.9 % IJ SOLN
INTRAMUSCULAR | Status: AC
  Filled 2017-06-07: qty 10

## 2017-06-07 MED ORDER — PROPOFOL 10 MG/ML IV BOLUS
INTRAVENOUS | Status: AC
  Filled 2017-06-07: qty 20

## 2017-06-07 MED ORDER — KETAMINE HCL 10 MG/ML IJ SOLN
INTRAMUSCULAR | Status: AC
  Filled 2017-06-07: qty 1

## 2017-06-07 MED ORDER — LIDOCAINE 2% (20 MG/ML) 5 ML SYRINGE
INTRAMUSCULAR | Status: AC
  Filled 2017-06-07: qty 5

## 2017-06-07 MED ORDER — HYDROMORPHONE HCL-NACL 0.5-0.9 MG/ML-% IV SOSY
0.5000 mg | PREFILLED_SYRINGE | INTRAVENOUS | Status: DC | PRN
  Administered 2017-06-07: 20:00:00 0.5 mg via INTRAVENOUS
  Filled 2017-06-07: qty 1

## 2017-06-07 MED ORDER — MIDAZOLAM HCL 2 MG/2ML IJ SOLN
INTRAMUSCULAR | Status: AC
  Filled 2017-06-07: qty 2

## 2017-06-07 MED ORDER — DEXAMETHASONE SODIUM PHOSPHATE 10 MG/ML IJ SOLN
INTRAMUSCULAR | Status: DC | PRN
  Administered 2017-06-07: 10 mg via INTRAVENOUS

## 2017-06-07 MED ORDER — VANCOMYCIN HCL IN DEXTROSE 1-5 GM/200ML-% IV SOLN
1000.0000 mg | INTRAVENOUS | Status: AC
  Administered 2017-06-07: 1000 mg via INTRAVENOUS
  Filled 2017-06-07: qty 200

## 2017-06-07 MED ORDER — MUPIROCIN 2 % EX OINT
1.0000 "application " | TOPICAL_OINTMENT | Freq: Two times a day (BID) | CUTANEOUS | Status: DC
  Administered 2017-06-07 – 2017-06-08 (×2): 1 via NASAL
  Filled 2017-06-07: qty 22

## 2017-06-07 MED ORDER — MENTHOL 3 MG MT LOZG: 1.0000 | LOZENGE | OROMUCOSAL | Status: DC | PRN

## 2017-06-07 MED ORDER — ONDANSETRON HCL 4 MG/2ML IJ SOLN: 4.0000 mg | Freq: Four times a day (QID) | INTRAMUSCULAR | Status: DC | PRN

## 2017-06-07 MED ORDER — BISACODYL 5 MG PO TBEC: 5.0000 mg | DELAYED_RELEASE_TABLET | Freq: Every day | ORAL | Status: DC | PRN

## 2017-06-07 MED ORDER — ONDANSETRON HCL 4 MG/2ML IJ SOLN
INTRAMUSCULAR | Status: AC
  Filled 2017-06-07: qty 2

## 2017-06-07 MED ORDER — POLYETHYLENE GLYCOL 3350 17 G PO PACK: 17.0000 g | PACK | Freq: Every day | ORAL | Status: DC | PRN

## 2017-06-07 MED ORDER — CHLORTHALIDONE 25 MG PO TABS
25.0000 mg | ORAL_TABLET | Freq: Every day | ORAL | Status: DC
  Administered 2017-06-08: 25 mg via ORAL
  Filled 2017-06-07: qty 1

## 2017-06-07 MED ORDER — CHLORHEXIDINE GLUCONATE CLOTH 2 % EX PADS
6.0000 | MEDICATED_PAD | Freq: Every day | CUTANEOUS | Status: DC
  Administered 2017-06-08: 07:00:00 6 via TOPICAL

## 2017-06-07 MED ORDER — PHENYLEPHRINE HCL 10 MG/ML IJ SOLN
INTRAMUSCULAR | Status: AC
  Filled 2017-06-07: qty 1

## 2017-06-07 MED ORDER — DULOXETINE HCL 60 MG PO CPEP
60.0000 mg | ORAL_CAPSULE | Freq: Every day | ORAL | Status: DC
  Administered 2017-06-08: 10:00:00 60 mg via ORAL
  Filled 2017-06-07: qty 1

## 2017-06-07 MED ORDER — TIZANIDINE HCL 2 MG PO TABS: 2.0000 mg | ORAL_TABLET | Freq: Four times a day (QID) | ORAL | 0 refills | Status: AC | PRN

## 2017-06-07 MED ORDER — LIDOCAINE-EPINEPHRINE 1 %-1:100000 IJ SOLN
INTRAMUSCULAR | Status: AC
  Filled 2017-06-07: qty 1

## 2017-06-07 MED ORDER — KETAMINE HCL 10 MG/ML IJ SOLN
INTRAMUSCULAR | Status: DC | PRN
  Administered 2017-06-07: 10 mg via INTRAVENOUS
  Administered 2017-06-07: 50 mg via INTRAVENOUS
  Administered 2017-06-07: 10 mg via INTRAVENOUS

## 2017-06-07 MED ORDER — SUFENTANIL CITRATE 50 MCG/ML IV SOLN
INTRAVENOUS | Status: AC
  Filled 2017-06-07: qty 1

## 2017-06-07 MED ORDER — LACTATED RINGERS IV SOLN
INTRAVENOUS | Status: DC
  Administered 2017-06-07 (×2): via INTRAVENOUS

## 2017-06-07 MED ORDER — BACITRACIN-NEOMYCIN-POLYMYXIN 400-5-5000 EX OINT
TOPICAL_OINTMENT | CUTANEOUS | Status: DC | PRN
  Administered 2017-06-07: 1 via TOPICAL

## 2017-06-07 MED ORDER — MIDAZOLAM HCL 2 MG/2ML IJ SOLN
INTRAMUSCULAR | Status: DC | PRN
  Administered 2017-06-07: 2 mg via INTRAVENOUS

## 2017-06-07 MED ORDER — METHOCARBAMOL 500 MG PO TABS
500.0000 mg | ORAL_TABLET | Freq: Four times a day (QID) | ORAL | Status: DC | PRN
  Administered 2017-06-07 – 2017-06-08 (×3): 500 mg via ORAL
  Filled 2017-06-07 (×3): qty 1

## 2017-06-07 MED ORDER — SUGAMMADEX SODIUM 200 MG/2ML IV SOLN
INTRAVENOUS | Status: DC | PRN
  Administered 2017-06-07: 300 mg via INTRAVENOUS

## 2017-06-07 MED ORDER — LABETALOL HCL 100 MG PO TABS
400.0000 mg | ORAL_TABLET | Freq: Two times a day (BID) | ORAL | Status: DC
  Administered 2017-06-07 – 2017-06-08 (×2): 400 mg via ORAL
  Filled 2017-06-07 (×2): qty 4

## 2017-06-07 MED ORDER — SUFENTANIL CITRATE 50 MCG/ML IV SOLN
INTRAVENOUS | Status: DC | PRN
  Administered 2017-06-07: 20 ug via INTRAVENOUS
  Administered 2017-06-07 (×2): 10 ug via INTRAVENOUS

## 2017-06-07 MED ORDER — BUPIVACAINE LIPOSOME 1.3 % IJ SUSP
20.0000 mL | Freq: Once | INTRAMUSCULAR | Status: DC
  Filled 2017-06-07: qty 20

## 2017-06-07 MED ORDER — SUCCINYLCHOLINE CHLORIDE 200 MG/10ML IV SOSY
PREFILLED_SYRINGE | INTRAVENOUS | Status: AC
  Filled 2017-06-07: qty 10

## 2017-06-07 MED ORDER — ROPINIROLE HCL 1 MG PO TABS
1.0000 mg | ORAL_TABLET | Freq: Every day | ORAL | Status: DC
  Administered 2017-06-07: 1 mg via ORAL
  Filled 2017-06-07: qty 1

## 2017-06-07 MED ORDER — ACETAMINOPHEN 650 MG RE SUPP: 650.0000 mg | RECTAL | Status: DC | PRN

## 2017-06-07 MED ORDER — LIDOCAINE-EPINEPHRINE 1 %-1:100000 IJ SOLN
INTRAMUSCULAR | Status: DC | PRN
  Administered 2017-06-07: 20 mL

## 2017-06-07 MED ORDER — ONDANSETRON HCL 4 MG PO TABS: 4.0000 mg | ORAL_TABLET | Freq: Four times a day (QID) | ORAL | Status: DC | PRN

## 2017-06-07 MED ORDER — PROPOFOL 10 MG/ML IV BOLUS
INTRAVENOUS | Status: DC | PRN
  Administered 2017-06-07: 100 mg via INTRAVENOUS

## 2017-06-07 MED ORDER — BUPIVACAINE LIPOSOME 1.3 % IJ SUSP
INTRAMUSCULAR | Status: DC | PRN
  Administered 2017-06-07: 20 mL

## 2017-06-07 MED ORDER — GABAPENTIN 300 MG PO CAPS
300.0000 mg | ORAL_CAPSULE | Freq: Three times a day (TID) | ORAL | Status: DC
  Administered 2017-06-07 – 2017-06-08 (×3): 300 mg via ORAL
  Filled 2017-06-07 (×3): qty 1

## 2017-06-07 MED ORDER — OXYCODONE HCL 5 MG PO TABS: 5.0000 mg | ORAL_TABLET | Freq: Once | ORAL | Status: DC | PRN

## 2017-06-07 MED ORDER — SODIUM CHLORIDE 0.9 % IV SOLN
INTRAVENOUS | Status: AC
  Filled 2017-06-07: qty 500000

## 2017-06-07 MED ORDER — ACETAMINOPHEN 325 MG PO TABS: 650.0000 mg | ORAL_TABLET | ORAL | Status: DC | PRN

## 2017-06-07 MED ORDER — PROMETHAZINE HCL 25 MG/ML IJ SOLN: 6.2500 mg | INTRAMUSCULAR | Status: DC | PRN

## 2017-06-07 MED ORDER — ATORVASTATIN CALCIUM 40 MG PO TABS
80.0000 mg | ORAL_TABLET | Freq: Every day | ORAL | Status: DC
Start: 2017-06-07 — End: 2017-06-08
  Administered 2017-06-07: 80 mg via ORAL
  Filled 2017-06-07: qty 2

## 2017-06-07 MED ORDER — EPHEDRINE SULFATE-NACL 50-0.9 MG/10ML-% IV SOSY
PREFILLED_SYRINGE | INTRAVENOUS | Status: DC | PRN
  Administered 2017-06-07 (×2): 10 mg via INTRAVENOUS
  Administered 2017-06-07: 20 mg via INTRAVENOUS
  Administered 2017-06-07: 10 mg via INTRAVENOUS

## 2017-06-07 MED ORDER — VANCOMYCIN HCL 10 G IV SOLR
1500.0000 mg | Freq: Once | INTRAVENOUS | Status: AC
  Administered 2017-06-07: 1500 mg via INTRAVENOUS
  Filled 2017-06-07: qty 1500

## 2017-06-07 MED ORDER — FLEET ENEMA 7-19 GM/118ML RE ENEM: 1.0000 | ENEMA | Freq: Once | RECTAL | Status: DC | PRN

## 2017-06-07 MED ORDER — PHENOL 1.4 % MT LIQD: 1.0000 | OROMUCOSAL | Status: DC | PRN

## 2017-06-07 MED ORDER — METHOCARBAMOL 1000 MG/10ML IJ SOLN
500.0000 mg | Freq: Four times a day (QID) | INTRAVENOUS | Status: DC | PRN
  Administered 2017-06-07: 500 mg via INTRAVENOUS
  Filled 2017-06-07: qty 550

## 2017-06-07 MED ORDER — CEFAZOLIN SODIUM-DEXTROSE 2-4 GM/100ML-% IV SOLN
2.0000 g | INTRAVENOUS | Status: AC
  Administered 2017-06-07: 2 g via INTRAVENOUS
  Filled 2017-06-07: qty 100

## 2017-06-07 MED ORDER — PHENYLEPHRINE HCL 10 MG/ML IJ SOLN
INTRAMUSCULAR | Status: DC | PRN
  Administered 2017-06-07: 50 ug/min via INTRAVENOUS

## 2017-06-07 MED ORDER — LIDOCAINE 2% (20 MG/ML) 5 ML SYRINGE
INTRAMUSCULAR | Status: DC | PRN
  Administered 2017-06-07: 100 mg via INTRAVENOUS

## 2017-06-07 MED ORDER — HYDROMORPHONE HCL-NACL 0.5-0.9 MG/ML-% IV SOSY: 0.2500 mg | PREFILLED_SYRINGE | INTRAVENOUS | Status: DC | PRN

## 2017-06-07 MED ORDER — SODIUM CHLORIDE 0.9 % IV SOLN
INTRAVENOUS | Status: DC | PRN
  Administered 2017-06-07: 500 mL

## 2017-06-07 MED ORDER — ONDANSETRON HCL 4 MG/2ML IJ SOLN
INTRAMUSCULAR | Status: DC | PRN
  Administered 2017-06-07: 4 mg via INTRAVENOUS

## 2017-06-07 MED ORDER — ROCURONIUM BROMIDE 10 MG/ML (PF) SYRINGE
PREFILLED_SYRINGE | INTRAVENOUS | Status: DC | PRN
  Administered 2017-06-07: 10 mg via INTRAVENOUS
  Administered 2017-06-07: 50 mg via INTRAVENOUS
  Administered 2017-06-07: 10 mg via INTRAVENOUS

## 2017-06-07 MED ORDER — SUGAMMADEX SODIUM 500 MG/5ML IV SOLN
INTRAVENOUS | Status: AC
  Filled 2017-06-07: qty 5

## 2017-06-07 MED ORDER — HYDROCODONE-ACETAMINOPHEN 5-325 MG PO TABS
1.0000 | ORAL_TABLET | ORAL | Status: DC | PRN
  Administered 2017-06-08 (×2): 1 via ORAL
  Filled 2017-06-07 (×2): qty 1

## 2017-06-07 MED ORDER — CHLORHEXIDINE GLUCONATE 4 % EX LIQD: 60.0000 mL | Freq: Once | CUTANEOUS | Status: DC

## 2017-06-07 MED ORDER — BACITRACIN-NEOMYCIN-POLYMYXIN 400-5-5000 EX OINT
TOPICAL_OINTMENT | CUTANEOUS | Status: AC
  Filled 2017-06-07: qty 1

## 2017-06-07 MED ORDER — IRBESARTAN 150 MG PO TABS
150.0000 mg | ORAL_TABLET | Freq: Every day | ORAL | Status: DC
  Administered 2017-06-08: 150 mg via ORAL
  Filled 2017-06-07: qty 1

## 2017-06-07 MED ORDER — OXYCODONE-ACETAMINOPHEN 5-325 MG PO TABS: 1.0000 | ORAL_TABLET | ORAL | 0 refills | Status: DC | PRN

## 2017-06-07 MED ORDER — DEXAMETHASONE SODIUM PHOSPHATE 10 MG/ML IJ SOLN
INTRAMUSCULAR | Status: AC
  Filled 2017-06-07: qty 1

## 2017-06-07 MED ORDER — OXYCODONE-ACETAMINOPHEN 5-325 MG PO TABS
2.0000 | ORAL_TABLET | ORAL | Status: DC | PRN
  Administered 2017-06-07 – 2017-06-08 (×5): 2 via ORAL
  Filled 2017-06-07 (×5): qty 2

## 2017-06-07 MED ORDER — LACTATED RINGERS IV SOLN
INTRAVENOUS | Status: DC
  Administered 2017-06-07: 14:00:00 via INTRAVENOUS

## 2017-06-07 MED ORDER — OXYCODONE HCL 5 MG/5ML PO SOLN: 5.0000 mg | Freq: Once | ORAL | Status: DC | PRN

## 2017-06-07 MED ORDER — TRAZODONE HCL 100 MG PO TABS
100.0000 mg | ORAL_TABLET | Freq: Every day | ORAL | Status: DC
  Administered 2017-06-07: 100 mg via ORAL
  Filled 2017-06-07: qty 1

## 2017-06-07 SURGICAL SUPPLY — 51 items
AGENT HMST SPONGE THK3/8 (HEMOSTASIS) ×1
APL SKNCLS STERI-STRIP NONHPOA (GAUZE/BANDAGES/DRESSINGS)
BAG SPEC THK2 15X12 ZIP CLS (MISCELLANEOUS) ×1
BAG ZIPLOCK 12X15 (MISCELLANEOUS) ×2 IMPLANT
BENZOIN TINCTURE PRP APPL 2/3 (GAUZE/BANDAGES/DRESSINGS) ×1 IMPLANT
CLEANER TIP ELECTROSURG 2X2 (MISCELLANEOUS) ×3 IMPLANT
COVER SURGICAL LIGHT HANDLE (MISCELLANEOUS) ×3 IMPLANT
DRAIN PENROSE 18X1/4 LTX STRL (WOUND CARE) IMPLANT
DRAPE MICROSCOPE LEICA (MISCELLANEOUS) ×3 IMPLANT
DRAPE POUCH INSTRU U-SHP 10X18 (DRAPES) ×3 IMPLANT
DRAPE SHEET LG 3/4 BI-LAMINATE (DRAPES) ×3 IMPLANT
DRAPE SURG 17X11 SM STRL (DRAPES) ×3 IMPLANT
DRSG ADAPTIC 3X8 NADH LF (GAUZE/BANDAGES/DRESSINGS) ×1 IMPLANT
DRSG EMULSION OIL 3X3 NADH (GAUZE/BANDAGES/DRESSINGS) ×2 IMPLANT
DRSG PAD ABDOMINAL 8X10 ST (GAUZE/BANDAGES/DRESSINGS) ×4 IMPLANT
DURAPREP 26ML APPLICATOR (WOUND CARE) ×3 IMPLANT
ELECT BLADE TIP CTD 4 INCH (ELECTRODE) ×3 IMPLANT
ELECT REM PT RETURN 15FT ADLT (MISCELLANEOUS) ×3 IMPLANT
GAUZE SPONGE 4X4 12PLY STRL (GAUZE/BANDAGES/DRESSINGS) ×1 IMPLANT
GLOVE BIOGEL PI IND STRL 6.5 (GLOVE) ×1 IMPLANT
GLOVE BIOGEL PI IND STRL 8.5 (GLOVE) ×1 IMPLANT
GLOVE BIOGEL PI INDICATOR 6.5 (GLOVE) ×2
GLOVE BIOGEL PI INDICATOR 8.5 (GLOVE) ×2
GLOVE ECLIPSE 8.0 STRL XLNG CF (GLOVE) ×6 IMPLANT
GLOVE SURG SS PI 6.5 STRL IVOR (GLOVE) ×3 IMPLANT
GOWN STRL REUS W/ TWL LRG LVL3 (GOWN DISPOSABLE) ×1 IMPLANT
GOWN STRL REUS W/TWL LRG LVL3 (GOWN DISPOSABLE) ×3
GOWN STRL REUS W/TWL XL LVL3 (GOWN DISPOSABLE) ×6 IMPLANT
HEMOSTAT SPONGE AVITENE ULTRA (HEMOSTASIS) ×3 IMPLANT
KIT BASIN OR (CUSTOM PROCEDURE TRAY) ×3 IMPLANT
KIT POSITIONING SURG ANDREWS (MISCELLANEOUS) ×3 IMPLANT
MANIFOLD NEPTUNE II (INSTRUMENTS) ×3 IMPLANT
MARKER SKIN DUAL TIP RULER LAB (MISCELLANEOUS) ×3 IMPLANT
NDL SPNL 18GX3.5 QUINCKE PK (NEEDLE) ×2 IMPLANT
NEEDLE HYPO 22GX1.5 SAFETY (NEEDLE) ×6 IMPLANT
NEEDLE SPNL 18GX3.5 QUINCKE PK (NEEDLE) ×6 IMPLANT
PACK LAMINECTOMY ORTHO (CUSTOM PROCEDURE TRAY) ×3 IMPLANT
PAD ABD 8X10 STRL (GAUZE/BANDAGES/DRESSINGS) ×6 IMPLANT
PATTIES SURGICAL .5 X.5 (GAUZE/BANDAGES/DRESSINGS) IMPLANT
PATTIES SURGICAL .75X.75 (GAUZE/BANDAGES/DRESSINGS) ×3 IMPLANT
PATTIES SURGICAL 1X1 (DISPOSABLE) ×3 IMPLANT
PIN SAFETY NICK PLATE  2 MED (MISCELLANEOUS)
PIN SAFETY NICK PLATE 2 MED (MISCELLANEOUS) IMPLANT
SPONGE LAP 4X18 X RAY DECT (DISPOSABLE) ×6 IMPLANT
STAPLER VISISTAT 35W (STAPLE) ×3 IMPLANT
SUT VIC AB 1 CT1 27 (SUTURE) ×6
SUT VIC AB 1 CT1 27XBRD ANTBC (SUTURE) ×2 IMPLANT
SUT VIC AB 2-0 CT1 27 (SUTURE) ×6
SUT VIC AB 2-0 CT1 TAPERPNT 27 (SUTURE) ×2 IMPLANT
SYR 20CC LL (SYRINGE) ×6 IMPLANT
TOWEL OR 17X26 10 PK STRL BLUE (TOWEL DISPOSABLE) ×3 IMPLANT

## 2017-06-07 NOTE — Anesthesia Postprocedure Evaluation (Signed)
Anesthesia Post Note  Patient: Kristopher [name]  Procedure(s) Performed: Procedure(s) (LRB): L3-L4 decompression (N/A)     Patient location during evaluation: PACU Anesthesia Type: General Level of consciousness: awake and alert Pain management: pain level controlled Vital Signs Assessment: post-procedure vital signs reviewed and stable Respiratory status: spontaneous breathing, nonlabored ventilation, respiratory function stable and patient connected to nasal cannula oxygen Cardiovascular status: blood pressure returned to baseline and stable Postop Assessment: no signs of nausea or vomiting Anesthetic complications: no    Last Vitals:  Vitals:   06/07/17 1300 06/07/17 1400  BP: 138/76 (!) 156/82  Pulse: 78 81  Resp: 16 18  Temp: 36.8 C 36.5 C  SpO2: 97% 92%    Last Pain:  Vitals:   06/07/17 1400  TempSrc: Oral  PainSc: 7                  Effie Janoski P Tiffiany Beadles

## 2017-06-07 NOTE — Progress Notes (Signed)
Pharmacy Antibiotic Note  Kristopher [name] is a 66 y.o. male admitted on 06/07/2017 with surgical prophylaxis.  Pharmacy has been consulted for vancomycin dosing.  Plan:  Vancomycin 1500 mg IV x 1 (using higher dose with wt > 100 kg)  Pharmacy sign off  Height: 6\' 1"  (185.4 cm) Weight: 262 lb (118.8 kg) IBW/kg (Calculated) : 79.9  Temp (24hrs), Avg:98.6 F (37 C), Min:97.7 F (36.5 C), Max:99.9 F (37.7 C)   Recent Labs Lab 06/02/17 1030 06/07/17 0745  WBC 6.8  --   CREATININE  --  1.27*    Estimated Creatinine Clearance: 76.2 mL/min (A) (by C-G formula based on SCr of 1.27 mg/dL (H)).    Allergies  Allergen Reactions  . Amlodipine Swelling    Thank you for allowing pharmacy to be a part of this patient's care.  Reuel Boom, PharmD, BCPS Pager: [phone] 06/07/2017, 4:01 PM

## 2017-06-07 NOTE — Interval H&P Note (Signed)
History and Physical Interval Note:  06/07/2017 8:29 AM  Kristopher [name]  has presented today for surgery, with the diagnosis of Lumbar spinal stenosis L3-L4  The various methods of treatment have been discussed with the patient and family. After consideration of risks, benefits and other options for treatment, the patient has consented to  Procedure(s): L3-L4 decompression (N/A) as a surgical intervention .  The patient's history has been reviewed, patient examined, no change in status, stable for surgery.  I have reviewed the patient's chart and labs.  Questions were answered to the patient's satisfaction.     Keiffer Piper A

## 2017-06-07 NOTE — Discharge Instructions (Signed)
For the first three days, remove your dressing, and tape a piece of saran wrap over your incision. Take your shower, then remove the saran wrap and put a clean dressing on. After three days you can shower without the saran wrap.  No lifting or bending No driving while taking pain medications.  Call Dr. Gladstone Lighter if any wound complications or temperature of 101 degrees F or over.  Call the office for an appointment to see Dr. Gladstone Lighter in two weeks: [phone] and ask for Dr. Charlestine Night nurse, Brunilda Payor.

## 2017-06-07 NOTE — Anesthesia Procedure Notes (Addendum)
Procedure Name: Intubation Date/Time: 06/07/2017 8:50 AM Performed by: Danley Danker L Patient Re-evaluated:Patient Re-evaluated prior to induction Oxygen Delivery Method: Circle system utilized Preoxygenation: Pre-oxygenation with 100% oxygen Induction Type: IV induction Ventilation: Mask ventilation without difficulty and Oral airway inserted - appropriate to patient size Laryngoscope Size: Miller and 2 Grade View: Grade III Tube type: Oral Tube size: 8.0 mm Number of attempts: 1 Airway Equipment and Method: Stylet Placement Confirmation: ETT inserted through vocal cords under direct vision,  positive ETCO2 and breath sounds checked- equal and bilateral Secured at: 23 cm Tube secured with: Tape Dental Injury: Dental damage  Comments: Slight scraping of the edge of the upper front tooth.

## 2017-06-07 NOTE — Anesthesia Preprocedure Evaluation (Addendum)
Anesthesia Evaluation  Patient identified by MRN, date of birth, ID band Patient awake    Reviewed: Allergy & Precautions, NPO status , Patient's Chart, lab work & pertinent test results  Airway Mallampati: IV  TM Distance: >3 FB Neck ROM: Full    Dental no notable dental hx.    Pulmonary sleep apnea and Continuous Positive Airway Pressure Ventilation ,    Pulmonary exam normal breath sounds clear to auscultation       Cardiovascular hypertension, Pt. on medications and Pt. on home beta blockers Normal cardiovascular exam Rhythm:Regular Rate:Normal  ECG: SB, rate 56   Neuro/Psych PSYCHIATRIC DISORDERS negative neurological ROS     GI/Hepatic Neg liver ROS, Controlled,  Endo/Other  negative endocrine ROS  Renal/GU negative Renal ROS     Musculoskeletal  (+) Arthritis , Osteoarthritis,  Restless legs   Abdominal (+) + obese,   Peds  Hematology negative hematology ROS (+)   Anesthesia Other Findings   Reproductive/Obstetrics                            Anesthesia Physical Anesthesia Plan  ASA: III  Anesthesia Plan: General   Post-op Pain Management:    Induction: Intravenous  PONV Risk Score and Plan: 2 and Ondansetron, Dexamethasone and Midazolam  Airway Management Planned: Oral ETT  Additional Equipment:   Intra-op Plan:   Post-operative Plan: Extubation in OR  Informed Consent: I have reviewed the patients History and Physical, chart, labs and discussed the procedure including the risks, benefits and alternatives for the proposed anesthesia with the patient or authorized representative who has indicated his/her understanding and acceptance.   Dental advisory given  Plan Discussed with: CRNA  Anesthesia Plan Comments:         Anesthesia Quick Evaluation

## 2017-06-07 NOTE — Brief Op Note (Signed)
06/07/2017  10:38 AM  PATIENT:  Kristopher [name]  66 y.o. male  PRE-OPERATIVE DIAGNOSIS:  Lumbar spinal stenosis L3-L4 and Foraminal Stenosis involving the L-4 and L-5 nerve Roots Bilaterally.  POST-OPERATIVE DIAGNOSIS:  Same as Pre-Op  PROCEDURE:  Procedure(s): L3-L4 decompression (N/A)  SURGEON:  Surgeon(s) and Role:    Latanya Maudlin, MD - Primary  PHYSICIAN ASSISTANT: [name] PA  ASSISTANTS:Amber Anna PA   ANESTHESIA:   general  EBL:  Total I/O In: 1000 [I.V.:1000] Out: 225 [Urine:150; Blood:75]  BLOOD ADMINISTERED:none  DRAINS: none   LOCAL MEDICATIONS USED:  MARCAINE20cc of 1% with Epinephrine at start of the case and 20cc of Exparel at the end of the case.     SPECIMEN:  No Specimen  DISPOSITION OF SPECIMEN:  N/A  COUNTS:  YES  TOURNIQUET:  * No tourniquets in log *  DICTATION: .Other Dictation: Dictation Number [id number]  PLAN OF CARE: Admit for overnight observation  PATIENT DISPOSITION:  Stable in OR   Delay start of Pharmacological VTE agent (>24hrs) due to surgical blood loss or risk of bleeding: yes

## 2017-06-07 NOTE — Transfer of Care (Signed)
Immediate Anesthesia Transfer of Care Note  Patient: Kristopher [name]  Procedure(s) Performed: Procedure(s): L3-L4 decompression (N/A)  Patient Location: PACU  Anesthesia Type:General  Level of Consciousness: awake, alert  and oriented  Airway & Oxygen Therapy: Patient Spontanous Breathing and Patient connected to face mask oxygen  Post-op Assessment: Report given to RN and Post -op Vital signs reviewed and stable  Post vital signs: Reviewed and stable  Last Vitals:  Vitals:   06/07/17 0708  BP: (!) 144/78  Pulse: 61  Resp: 16  Temp: 37.3 C  SpO2: 97%    Last Pain:  Vitals:   06/07/17 0728  TempSrc:   PainSc: 6          Complications: No apparent anesthesia complications

## 2017-06-07 NOTE — Evaluation (Signed)
Physical Therapy Evaluation Patient Details Name: Kristopher [name] MRN: [id number] DOB: 1951/09/06 Today's Date: 06/07/2017   History of Present Illness  Pt s/p L3-4 decompression and with hx of previous back surg (93) and pelvic fx (14)  Clinical Impression  Pt s/p back surgery and presents with functional mobility limitations 2* post op pain and back precautions.  Pt should progress well to dc home with family assist.    Follow Up Recommendations No PT follow up    Equipment Recommendations  None recommended by PT    Recommendations for Other Services OT consult     Precautions / Restrictions Precautions Precautions: Back Precaution Booklet Issued: Yes (comment) Restrictions Weight Bearing Restrictions: No      Mobility  Bed Mobility Overal bed mobility: Needs Assistance Bed Mobility: Supine to Sit;Sit to Supine     Supine to sit: Min guard Sit to supine: Min guard   General bed mobility comments: cues for correct log roll technique and adherence to back precautions  Transfers Overall transfer level: Needs assistance Equipment used: Rolling walker (2 wheeled) Transfers: Sit to/from Stand Sit to Stand: Min guard         General transfer comment: cues for transition position, use of UEs to self assist and adherence to back precautions  Ambulation/Gait Ambulation/Gait assistance: Min guard Ambulation Distance (Feet): 350 Feet Assistive device: None Gait Pattern/deviations: Step-through pattern;Decreased step length - right;Decreased step length - left;Shuffle;Wide base of support Gait velocity: decr Gait velocity interpretation: Below normal speed for age/gender General Gait Details: min instability, no LOB, good safety awareness  Stairs            Wheelchair Mobility    Modified Rankin (Stroke Patients Only)       Balance Overall balance assessment: No apparent balance deficits (not formally assessed)                                            Pertinent Vitals/Pain Pain Assessment: 0-10 Pain Score: 3  Pain Location: back Pain Descriptors / Indicators: Aching;Sore Pain Intervention(s): Limited activity within patient's tolerance;Monitored during session;Premedicated before session    [place] expects to be discharged to:: Private residence Living Arrangements: Spouse/significant other Available Help at Discharge: Family Type of Home: House Home Access: Stairs to enter Entrance Stairs-Rails: Right Entrance Stairs-Number of Steps: 3 Home Layout: One level Home Equipment: Environmental consultant - 2 wheels      Prior Function Level of Independence: Independent               Hand Dominance        Extremity/Trunk Assessment   Upper Extremity Assessment Upper Extremity Assessment: Overall WFL for tasks assessed    Lower Extremity Assessment Lower Extremity Assessment: Overall WFL for tasks assessed    Cervical / Trunk Assessment Cervical / Trunk Assessment: Normal  Communication   Communication: No difficulties  Cognition Arousal/Alertness: Awake/alert Behavior During Therapy: WFL for tasks assessed/performed Overall Cognitive Status: Within Functional Limits for tasks assessed                                        General Comments      Exercises General Exercises - Lower Extremity Ankle Circles/Pumps: AROM;Both;20 reps;Supine   Assessment/Plan    PT Assessment Patient needs continued PT  services  PT Problem List Decreased activity tolerance;Decreased mobility;Decreased knowledge of use of DME;Pain;Decreased knowledge of precautions       PT Treatment Interventions DME instruction;Gait training;Stair training;Functional mobility training;Therapeutic exercise;Therapeutic activities;Patient/family education    PT Goals (Current goals can be found in the Care Plan section)  Acute Rehab PT Goals Patient Stated Goal: Resume previous lifestyle with decreased  pain PT Goal Formulation: With patient Time For Goal Achievement: 06/08/17 Potential to Achieve Goals: Good    Frequency 7X/week   Barriers to discharge        Co-evaluation               AM-PAC PT "6 Clicks" Daily Activity  Outcome Measure Difficulty turning over in bed (including adjusting bedclothes, sheets and blankets)?: A Little Difficulty moving from lying on back to sitting on the side of the bed? : A Little Difficulty sitting down on and standing up from a chair with arms (e.g., wheelchair, bedside commode, etc,.)?: A Little Help needed moving to and from a bed to chair (including a wheelchair)?: A Little Help needed walking in hospital room?: A Little Help needed climbing 3-5 steps with a railing? : A Little 6 Click Score: 18    End of Session   Activity Tolerance: Patient tolerated treatment well Patient left: in bed;with call bell/phone within reach;with family/visitor present Nurse Communication: Mobility status PT Visit Diagnosis: Difficulty in walking, not elsewhere classified (R26.2)    Time: 5409-8119 PT Time Calculation (min) (ACUTE ONLY): 22 min   Charges:   PT Evaluation $PT Eval Low Complexity: 1 Low     PT G Codes:        Pg [phone]   Suri Tafolla 06/07/2017, 6:30 PM

## 2017-06-08 DIAGNOSIS — M48062 Spinal stenosis, lumbar region with neurogenic claudication: Secondary | ICD-10-CM | POA: Diagnosis not present

## 2017-06-08 NOTE — Progress Notes (Signed)
Physical Therapy Treatment Patient Details Name: Kristopher [name] MRN: [id number] DOB: 1951-07-12 Today's Date: 06/08/2017    History of Present Illness Pt s/p L3-4 decompression and with hx of previous back surg (93) and pelvic fx (14)    PT Comments    Pt progressing well with mobility and with good awareness of safety and back precautions.  Pt eager for dc home.   Follow Up Recommendations  No PT follow up     Equipment Recommendations  None recommended by PT    Recommendations for Other Services OT consult     Precautions / Restrictions Precautions Precautions: Back Precaution Booklet Issued: Yes (comment) Precaution Comments: Pt recalls 3/4 back precautions sans cueing Restrictions Weight Bearing Restrictions: No    Mobility  Bed Mobility Overal bed mobility: Needs Assistance Bed Mobility: Supine to Sit     Supine to sit: Supervision     General bed mobility comments: oob  Transfers Overall transfer level: Independent Equipment used: Rolling walker (2 wheeled) Transfers: Sit to/from Stand Sit to Stand: Supervision         General transfer comment: min cues for transition position, use of UEs to self assist and adherence to back precautions  Ambulation/Gait Ambulation/Gait assistance: Supervision Ambulation Distance (Feet): 450 Feet Assistive device: None Gait Pattern/deviations: Step-through pattern;Decreased step length - right;Decreased step length - left;Shuffle;Wide base of support Gait velocity: decr Gait velocity interpretation: Below normal speed for age/gender General Gait Details: min instability, no LOB, good safety awareness   Stairs Stairs: Yes   Stair Management: Two rails;Step to pattern;Forwards;Alternating pattern Number of Stairs: 5 General stair comments: min cues for sequence  Wheelchair Mobility    Modified Rankin (Stroke Patients Only)       Balance Overall balance assessment: No apparent balance deficits (not  formally assessed)                                          Cognition Arousal/Alertness: Awake/alert Behavior During Therapy: WFL for tasks assessed/performed Overall Cognitive Status: Within Functional Limits for tasks assessed                                        Exercises      General Comments        Pertinent Vitals/Pain Pain Assessment: 0-10 Pain Score: 1  Pain Location: back Pain Descriptors / Indicators: Aching;Sore Pain Intervention(s): Limited activity within patient's tolerance;Monitored during session;Premedicated before session;Repositioned    Home Living Family/patient expects to be discharged to:: Private residence Living Arrangements: Spouse/significant other           Home Equipment: Bedside commode      Prior Function            PT Goals (current goals can now be found in the care plan section) Acute Rehab PT Goals Patient Stated Goal: Resume previous lifestyle with decreased pain PT Goal Formulation: With patient Time For Goal Achievement: 06/08/17 Potential to Achieve Goals: Good Progress towards PT goals: Progressing toward goals    Frequency    7X/week      PT Plan Current plan remains appropriate    Co-evaluation              AM-PAC PT "6 Clicks" Daily Activity  Outcome Measure  Difficulty turning over in bed (  including adjusting bedclothes, sheets and blankets)?: A Little Difficulty moving from lying on back to sitting on the side of the bed? : A Little Difficulty sitting down on and standing up from a chair with arms (e.g., wheelchair, bedside commode, etc,.)?: A Little Help needed moving to and from a bed to chair (including a wheelchair)?: A Little Help needed walking in hospital room?: A Little Help needed climbing 3-5 steps with a railing? : A Little 6 Click Score: 18    End of Session   Activity Tolerance: Patient tolerated treatment well Patient left: in chair;with call  bell/phone within reach Nurse Communication: Mobility status PT Visit Diagnosis: Difficulty in walking, not elsewhere classified (R26.2)     Time: 5643-3295 PT Time Calculation (min) (ACUTE ONLY): 16 min  Charges:  $Gait Training: 8-22 mins                    G Codes:       Pg [phone]    Vaneta Hammontree 06/08/2017, 9:02 AM

## 2017-06-08 NOTE — Evaluation (Signed)
Occupational Therapy Evaluation Patient Details Name: Kristopher [name] MRN: [id number] DOB: Jan 14, 1951 Today's Date: 06/08/2017    History of Present Illness (P) Pt s/p L3-4 decompression and with hx of previous back surg (93) and pelvic fx (14)   Clinical Impression   This 66 year old man was admitted for the above sx. All education was completed. NO further OT is needed at thsi time    Follow Up Recommendations  No OT follow up    Equipment Recommendations  None recommended by OT    Recommendations for Other Services       Precautions / Restrictions Precautions Precautions: (P) Back Precaution Booklet Issued: Yes (comment) Precaution Comments: (P) Pt recalls 3/4 back precautions sans cueing Restrictions Weight Bearing Restrictions: (P) No      Mobility Bed Mobility Overal bed mobility: (P) Needs Assistance Bed Mobility: (P) Supine to Sit     Supine to sit: (P) Supervision     General bed mobility comments: oob  Transfers Overall transfer level: Independent Equipment used: (P) Rolling walker (2 wheeled) Transfers: (P) Sit to/from Stand Sit to Stand: (P) Supervision         General transfer comment: (P) min cues for transition position, use of UEs to self assist and adherence to back precautions    Balance                                           ADL either performed or assessed with clinical judgement   ADL Overall ADL's : Needs assistance/impaired Eating/Feeding: Independent   Grooming: Set up;Supervision/safety;Standing;Oral care   Upper Body Bathing: Set up;Sitting   Lower Body Bathing: Moderate assistance;Sit to/from stand   Upper Body Dressing : Set up;Sitting   Lower Body Dressing: Maximal assistance;Sit to/from stand   Toilet Transfer: Copy Details (indicate cue type and reason): simulated chair Toileting- Clothing Manipulation and Hygiene: Supervision/safety;Sit to/from stand          General ADL Comments: reviewed back precautions and performed ADL. Wife will assist as needed. Reviewed AE, if desired and alternate positions     Vision         Perception     Praxis      Pertinent Vitals/Pain Pain Assessment: (P) 0-10 Pain Score: 1  Pain Location: back Pain Descriptors / Indicators: Aching;Sore Pain Intervention(s): Limited activity within patient's tolerance;Monitored during session;Premedicated before session;Repositioned     Hand Dominance     Extremity/Trunk Assessment Upper Extremity Assessment Upper Extremity Assessment: Overall WFL for tasks assessed           Communication     Cognition Arousal/Alertness: Awake/alert Behavior During Therapy: WFL for tasks assessed/performed Overall Cognitive Status: Within Functional Limits for tasks assessed                                     General Comments       Exercises     Shoulder Instructions      Home Living Family/patient expects to be discharged to:: Private residence Living Arrangements: Spouse/significant other                 Bathroom Shower/Tub: Occupational psychologist: Handicapped height     Home Equipment: Bedside commode          Prior  Functioning/Environment                   OT Problem List:        OT Treatment/Interventions:      OT Goals(Current goals can be found in the care plan section) Acute Rehab OT Goals Patient Stated Goal: Resume previous lifestyle with decreased pain  OT Frequency:     Barriers to D/C:            Co-evaluation              AM-PAC PT "6 Clicks" Daily Activity     Outcome Measure Help from another person eating meals?: None Help from another person taking care of personal grooming?: A Little Help from another person toileting, which includes using toliet, bedpan, or urinal?: A Little Help from another person bathing (including washing, rinsing, drying)?: A Lot Help from another  person to put on and taking off regular upper body clothing?: A Little Help from another person to put on and taking off regular lower body clothing?: A Lot 6 Click Score: 17   End of Session    Activity Tolerance: Patient tolerated treatment well Patient left: in chair;with call bell/phone within reach  OT Visit Diagnosis: Muscle weakness (generalized) (M62.81)                Time: 3762-8315 OT Time Calculation (min): 20 min Charges:  OT General Charges $OT Visit: 1 Visit OT Evaluation $OT Eval Low Complexity: 1 Low G-Codes: OT G-codes **NOT FOR INPATIENT CLASS** Functional Assessment Tool Used: Clinical judgement Functional Limitation: Self care Self Care Current Status (V7616): At least 60 percent but less than 80 percent impaired, limited or restricted Self Care Goal Status (W7371): At least 60 percent but less than 80 percent impaired, limited or restricted Self Care Discharge Status [phone]): At least 60 percent but less than 80 percent impaired, limited or restricted   Kristopher [name], OTR/L [phone] 06/08/2017  Kristopher [name] 06/08/2017, 9:02 AM

## 2017-06-08 NOTE — Progress Notes (Signed)
Patient discharged to home w/ family. Given all belongings, instructions, prescriptions. Drsg change complete w/ education provided to family. piv d/c'd tip intact. Patient and family verbalized understanding of all instructions. Escorted to pov via w/c.

## 2017-06-08 NOTE — Progress Notes (Signed)
Subjective: 1 Day Post-Op Procedure(s) (LRB): L3-L4 decompression (N/A) Patient reports pain as 1 on 0-10 scale.  Doing very well. No leg pain and normal Neurological exam.  Objective: Vital signs in last 24 hours: Temp:  [97.7 F (36.5 C)-99.9 F (37.7 C)] 97.7 F (36.5 C) (08/30 0629) Pulse Rate:  [60-94] 60 (08/30 0629) Resp:  [16-18] 17 (08/30 0629) BP: (117-158)/(65-82) 131/68 (08/30 0629) SpO2:  [91 %-97 %] 95 % (08/30 0629) Weight:  [118.8 kg (262 lb)] 118.8 kg (262 lb) (08/29 0728)  Intake/Output from previous day: 08/29 0701 - 08/30 0700 In: 4770 [P.O.:920; I.V.:3300; IV Piggyback:550] Out: 9924 [Urine:3430; Blood:75] Intake/Output this shift: No intake/output data recorded.  No results for input(s): HGB in the last 72 hours. No results for input(s): WBC, RBC, HCT, PLT in the last 72 hours.  Recent Labs  06/07/17 0745  NA 141  K 3.5  CL 105  CO2 27  BUN 22*  CREATININE 1.27*  GLUCOSE 131*  CALCIUM 9.2   No results for input(s): LABPT, INR in the last 72 hours.  Neurologically intact Compartment soft  Assessment/Plan: 1 Day Post-Op Procedure(s) (LRB): L3-L4 decompression (N/A) Up with therapy discontinued today.  Kristopher [name] A 06/08/2017, 7:20 AM

## 2017-06-08 NOTE — Op Note (Signed)
Kristopher [name], LUMLEY               ACCOUNT NO.:  [id number]  MEDICAL RECORD NO.:  [id number]  LOCATION:                                 FACILITY:  PHYSICIAN:  Kipp Brood. Hildreth Orsak, M.D.DATE OF BIRTH:  Oct 20, 1950  DATE OF PROCEDURE:  06/07/2017 DATE OF DISCHARGE:                              OPERATIVE REPORT   SURGEON:  Dr. Gladstone Lighter.  ASSISTANT:  [name], PA.  PREOPERATIVE DIAGNOSES: 1. Severe spinal stenosis with a complete block at L3-L4. 2. Severe foraminal stenosis involving the L4 and L5 roots     bilaterally.  POSTOPERATIVE DIAGNOSES: 1. Severe spinal stenosis with a complete block at L3-L4. 2. Severe foraminal stenosis involving the L4 and L5 roots     bilaterally.  OPERATION: 1. Complete decompressive lumbar laminectomy at L3-L4. 2. Foraminotomies for the L4 and the L5 roots bilaterally.  DESCRIPTION OF PROCEDURE:  Under general anesthesia, the patient was on a spinal frame and routine orthopedic prep and draping of the back was carried out.  Appropriate time-out was first carried out.  Also, marked the appropriate left side of his back, which was the symptomatic leg in the holding area.  Two needles then were placed in the back for localization purposes and x-ray was taken.  At this time, an incision was made through the old incision site and extended proximally.  Note, he had a previous lumbar laminectomy at L4 and L5 on the right years ago.  At this time, the bleeders were identified and cauterized.  Prior to making the incision, I injected 20 mL of 1% Marcaine with epinephrine.  At this time, the muscle was stripped from the lamina and spinous processes bilaterally.  Bleeders were identified and cauterized. At this time, a Kocher clamp was then placed over the spinous process of L3 and another x-ray was taken to verify the position.  I then went down and did a complete lumbar laminectomy at L3-L4.  I extended the laminectomy proximally and distally.  At  this particular time, I then went on and did a hemilaminectomy distal to the L3-L4 space.  So, the hemilaminectomy would be at L4-L5.  We then brought the microscope in after another x-ray was taken to verify the position, and at this particular point, we then carefully dissected the thickened ligamentum flavum off the dura.  The area was extremely tight and we took a great deal of time and use of the microscope to free up the dura.  We utilized the [employer] #4 as well as the hockey sticks to make sure we were up above the dura and up onto the ligamentum flavum.  We then went out and decompressed the lateral recesses.  When we were finished with the decompression, we were easily able to pass the hockey-stick out of the foramina for the L4 and the L5 root up under the lamina proximally and distally to make sure we were now wide open and we were.  We thoroughly irrigated out the area, loosely applied some thrombin-soaked Gelfoam, closed the wound in layers in the usual fashion.  I left the small distal and proximal deep parts of the wound open for drainage purposes. Subcu was closed  in the usual fashion.  Skin was closed with metal staples.  At the end of the procedure, I injected 20 mL of Exparel prior to closing the subcu and the skin.  Sterile dressings were applied.  He had 2 g of IV Ancef preop.  We did go through all the MRSA precautions as well.  He had 1 g of vancomycin there preop and 2 g of Ancef.          ______________________________ Kipp Brood. Gladstone Lighter, M.D.     RAG/MEDQ  D:  06/07/2017  T:  06/08/2017  Job:  [id number]

## 2017-06-10 NOTE — Progress Notes (Signed)
   06/08/17 0830  PT Time Calculation  PT Start Time (ACUTE ONLY) 0805  PT Stop Time (ACUTE ONLY) 0821  PT Time Calculation (min) (ACUTE ONLY) 16 min  PT G-Codes **NOT FOR INPATIENT CLASS**  Functional Assessment Tool Used Clinical judgement  Functional Limitation Mobility: Walking and moving around  Mobility: Walking and Moving Around Current Status (I2979) CI  Mobility: Walking and Moving Around Goal Status (G9211) CI  PT General Charges  $$ ACUTE PT VISIT 1 Visit  PT Treatments  $Gait Training 8-22 mins

## 2017-06-13 NOTE — Discharge Summary (Signed)
Physician Discharge Summary   Patient ID: Kristopher [name] MRN: [id number] DOB/AGE: Mar 27, 1951 66 y.o.  Admit date: 06/07/2017 Discharge date: 06/08/2017  Primary Diagnosis: Lumbar spinal stenosis  Admission Diagnoses:  Past Medical History:  Diagnosis Date  . GERD (gastroesophageal reflux disease)   . Hyperlipidemia   . Hypertension   . Insomnia   . Lower extremity edema    reaction that occurred with with amlopdipine ; currently not taking  . Osteoarthritis    Spine, hips, knees  . Restless legs   . Sleep apnea    regular  CPAP use    Discharge Diagnoses:   Active Problems:   Spinal stenosis, lumbar region with neurogenic claudication  Estimated body mass index is 34.57 kg/m as calculated from the following:   Height as of this encounter: _0  (1.854 m).   Weight as of this encounter: 118.8 kg (262 lb).  Procedure:  Procedure(s) (LRB): L3-L4 decompression (N/A)   Consults: None  HPI: The patient is a 66 year old male who presented with the chief complaint of low back pain. He started to develop pain in the left LE. He has a history of a lumbar decompression/microdiscectomy L4-L5 in 1993. He did not see relief of his symptoms with conservative treatments including both oral corticosteroids and epidural steroid injections. CT myelogram showed severe stenosis at L3-L4.   Laboratory Data: Admission on 06/07/2017, Discharged on 06/08/2017  Component Date Value Ref Range Status  . Sodium 06/07/2017 141  135 - 145 mmol/L Final  . Potassium 06/07/2017 3.5  3.5 - 5.1 mmol/L Final  . Chloride 06/07/2017 105  101 - 111 mmol/L Final  . CO2 06/07/2017 27  22 - 32 mmol/L Final  . Glucose, Bld 06/07/2017 131* 65 - 99 mg/dL Final  . BUN 06/07/2017 22* 6 - 20 mg/dL Final  . Creatinine, Ser 06/07/2017 1.27* 0.61 - 1.24 mg/dL Final  . Calcium 06/07/2017 9.2  8.9 - 10.3 mg/dL Final  . Total Protein 06/07/2017 7.1  6.5 - 8.1 g/dL Final  . Albumin 06/07/2017 4.4  3.5 - 5.0 g/dL Final    . AST 06/07/2017 26  15 - 41 U/L Final  . ALT 06/07/2017 35  17 - 63 U/L Final  . Alkaline Phosphatase 06/07/2017 72  38 - 126 U/L Final  . Total Bilirubin 06/07/2017 0.8  0.3 - 1.2 mg/dL Final  . GFR calc non Af Amer 06/07/2017 57* >60 mL/min Final  . GFR calc Af Amer 06/07/2017 >60  >60 mL/min Final   Comment: (NOTE) The eGFR has been calculated using the CKD EPI equation. This calculation has not been validated in all clinical situations. eGFR's persistently <60 mL/min signify possible Chronic Kidney Disease.   Georgiann Hahn gap 06/07/2017 9  5 - 15 Final  Hospital Outpatient Visit on 06/02/2017  Component Date Value Ref Range Status  . aPTT 06/02/2017 27  24 - 36 seconds Final  . WBC 06/02/2017 6.8  4.0 - 10.5 K/uL Final  . RBC 06/02/2017 4.41  4.22 - 5.81 MIL/uL Final  . Hemoglobin 06/02/2017 13.7  13.0 - 17.0 g/dL Final  . HCT 06/02/2017 41.0  39.0 - 52.0 % Final  . MCV 06/02/2017 93.0  78.0 - 100.0 fL Final  . MCH 06/02/2017 31.1  26.0 - 34.0 pg Final  . MCHC 06/02/2017 33.4  30.0 - 36.0 g/dL Final  . RDW 06/02/2017 13.2  11.5 - 15.5 % Final  . Platelets 06/02/2017 202  150 - 400 K/uL Final  .  Neutrophils Relative % 06/02/2017 61  % Final  . Neutro Abs 06/02/2017 4.1  1.7 - 7.7 K/uL Final  . Lymphocytes Relative 06/02/2017 26  % Final  . Lymphs Abs 06/02/2017 1.8  0.7 - 4.0 K/uL Final  . Monocytes Relative 06/02/2017 11  % Final  . Monocytes Absolute 06/02/2017 0.7  0.1 - 1.0 K/uL Final  . Eosinophils Relative 06/02/2017 2  % Final  . Eosinophils Absolute 06/02/2017 0.2  0.0 - 0.7 K/uL Final  . Basophils Relative 06/02/2017 0  % Final  . Basophils Absolute 06/02/2017 0.0  0.0 - 0.1 K/uL Final  . Prothrombin Time 06/02/2017 13.4  11.4 - 15.2 seconds Final  . INR 06/02/2017 1.02   Final  . Color, Urine 06/02/2017 YELLOW  YELLOW Final  . APPearance 06/02/2017 CLEAR  CLEAR Final  . Specific Gravity, Urine 06/02/2017 >1.030* 1.005 - 1.030 Final  . pH 06/02/2017 5.5  5.0 - 8.0  Final  . Glucose, UA 06/02/2017 NEGATIVE  NEGATIVE mg/dL Final  . Hgb urine dipstick 06/02/2017 NEGATIVE  NEGATIVE Final  . Bilirubin Urine 06/02/2017 NEGATIVE  NEGATIVE Final  . Ketones, ur 06/02/2017 NEGATIVE  NEGATIVE mg/dL Final  . Protein, ur 06/02/2017 NEGATIVE  NEGATIVE mg/dL Final  . Nitrite 06/02/2017 NEGATIVE  NEGATIVE Final  . Leukocytes, UA 06/02/2017 NEGATIVE  NEGATIVE Final  . RBC / HPF 06/02/2017 0-5  0 - 5 RBC/hpf Final  . WBC, UA 06/02/2017 0-5  0 - 5 WBC/hpf Final  . Bacteria, UA 06/02/2017 NONE SEEN  NONE SEEN Final  . Squamous Epithelial / LPF 06/02/2017 NONE SEEN  NONE SEEN Final  . Mucus 06/02/2017 PRESENT   Final  . MRSA, PCR 06/02/2017 POSITIVE* NEGATIVE Final   Comment: RESULT CALLED TO, READ BACK BY AND VERIFIED WITH: [place],V @ 1145 ON [id number] BY POTEAT,S   . Staphylococcus aureus 06/02/2017 POSITIVE* NEGATIVE Final   Comment:        The Xpert SA Assay (FDA approved for NASAL specimens in patients over 71 years of age), is one component of a comprehensive surveillance program.  Test performance has been validated by [facility] for patients greater than or equal to 4 year old. It is not intended to diagnose infection nor to guide or monitor treatment.      X-Rays:Dg Chest 2 View  Result Date: 06/02/2017 CLINICAL DATA:  Preop lumbar surgery.  Hypertension. EXAM: CHEST  2 VIEW COMPARISON:  None. FINDINGS: Heart and mediastinal contours are within normal limits. No focal opacities or effusions. No acute bony abnormality. IMPRESSION: No active cardiopulmonary disease. Electronically Signed   By: Rolm Baptise M.D.   On: 06/02/2017 11:07   Dg Lumbar Spine 2-3 Views  Result Date: 06/02/2017 CLINICAL DATA:  Preop evaluation for upcoming lumbar surgery EXAM: LUMBAR SPINE - 2 VIEW COMPARISON:  10/25/2016 FINDINGS: Five lumbar type vertebral bodies are well visualized. Vertebral body height is well maintained. Mild scoliosis concave to the left is noted.  Some compensatory osteophytes are seen. Fixation screws are noted traversing the sacrum. No soft tissue abnormality is noted. IMPRESSION: Chronic changes without acute abnormality. Electronically Signed   By: Inez Catalina M.D.   On: 06/02/2017 11:12   Dg Spine Portable 1 View  Result Date: 06/07/2017 CLINICAL DATA:  Intraoperative imaging for lumbar spine decompression. EXAM: PORTABLE SPINE - 1 VIEW COMPARISON:  06/02/2017 FINDINGS: Since the earlier operative imaging exam, a surgical probe has been inserted. The tip projects over the base of the spinous process of L3, 19  mm posterior to the posterior margin of the L3 vertebra and 16 mm superior to the posterior margin of the L3-L4 disc space. IMPRESSION: Intraoperative imaging exam as described. Electronically Signed   By: Lajean Manes M.D.   On: 06/07/2017 10:16   Dg Spine Portable 1 View  Result Date: 06/07/2017 CLINICAL DATA:  L3-4 lumbar decompression EXAM: PORTABLE SPINE - 1 VIEW COMPARISON:  06/07/2017 FINDINGS: Posterior surgical instrument overlies the L3 spinous process. IMPRESSION: Intraoperative localization as above. Electronically Signed   By: Rolm Baptise M.D.   On: 06/07/2017 09:46   Dg Spine Portable 1 View  Result Date: 06/07/2017 CLINICAL DATA:  L3-4 decompression. EXAM: PORTABLE SPINE - 1 VIEW COMPARISON:  Radiographs of June 02, 2017. FINDINGS: Single intraoperative cross-table lateral projection of the lumbar spine was obtained. These demonstrate surgical probes directed toward the posterior spinous processes of L3 and L4. IMPRESSION: Surgical localization as described above. Electronically Signed   By: Marijo Conception, M.D.   On: 06/07/2017 09:34    EKG: Orders placed or performed during the hospital encounter of 06/02/17  . EKG  . EKG     Hospital Course: Kristopher [name] is a 66 y.o. who was admitted to [facility]. They were brought to the operating room on 06/07/2017 and underwent Procedure(s): L3-L4  decompression.  Patient tolerated the procedure well and was later transferred to the recovery room and then to the orthopaedic floor for postoperative care.  They were given PO and IV analgesics for pain control following their surgery.  They were given 24 hours of postoperative antibiotics of  Anti-infectives    Start     Dose/Rate Route Frequency Ordered Stop   06/07/17 2000  vancomycin (VANCOCIN) 1,500 mg in sodium chloride 0.9 % 500 mL IVPB     1,500 mg 250 mL/hr over 120 Minutes Intravenous  Once 06/07/17 1600 06/07/17 2157   06/07/17 0932  polymyxin B 500,000 Units, bacitracin 50,000 Units in sodium chloride 0.9 % 500 mL irrigation  Status:  Discontinued       As needed 06/07/17 0932 06/07/17 1100   06/07/17 0715  vancomycin (VANCOCIN) IVPB 1000 mg/200 mL premix     1,000 mg 200 mL/hr over 60 Minutes Intravenous On call to O.R. 06/07/17 8119 06/07/17 0938   06/07/17 0704  ceFAZolin (ANCEF) IVPB 2g/100 mL premix     2 g 200 mL/hr over 30 Minutes Intravenous On call to O.R. 06/07/17 1478 06/07/17 0953     and started on DVT prophylaxis in the form of Aspirin.   PT was ordered.  Discharge planning consulted to help with postop disposition and equipment needs.  Patient had a good night on the evening of surgery.  They started to get up OOB with therapy on day one.  Dressing was changed and the incision was clean and dry. The patient had progressed with therapy and meeting their goals.  Incision was healing well.  Patient was seen in rounds and was ready to go home.   Diet: Cardiac diet Activity:WBAT Follow-up:in 2 weeks Disposition - Home Discharged Condition: stable   Discharge Instructions    Call MD / Call 911    Complete by:  As directed    If you experience chest pain or shortness of breath, CALL 911 and be transported to the hospital emergency room.  If you develope a fever above 101 F, pus (white drainage) or increased drainage or redness at the wound, or calf pain, call your  surgeon's  office.   Constipation Prevention    Complete by:  As directed    Drink plenty of fluids.  Prune juice may be helpful.  You may use a stool softener, such as Colace (over the counter) 100 mg twice a day.  Use MiraLax (over the counter) for constipation as needed.   Diet - low sodium heart healthy    Complete by:  As directed    Discharge instructions    Complete by:  As directed    For the first three days, remove your dressing, and tape a piece of saran wrap over your incision. Take your shower, then remove the saran wrap and put a clean dressing on. After three days you can shower without the saran wrap.  No lifting or bending No driving while taking pain medications.  Call Dr. Gladstone Lighter if any wound complications or temperature of 101 degrees F or over.  Call the office for an appointment to see Dr. Gladstone Lighter in two weeks: [phone] and ask for Dr. Charlestine Night nurse, Brunilda Payor.   Increase activity slowly as tolerated    Complete by:  As directed      Allergies as of 06/08/2017      Reactions   Amlodipine Swelling      Medication List    STOP taking these medications   acetaminophen 325 MG tablet Commonly known as:  TYLENOL   traMADol 50 MG tablet Commonly known as:  ULTRAM     TAKE these medications   aspirin 81 MG tablet Take 81 mg by mouth daily.   atorvastatin 80 MG tablet Commonly known as:  LIPITOR Take 80 mg by mouth at bedtime. What changed:  Another medication with the same name was removed. Continue taking this medication, and follow the directions you see here.   CALCIUM 600 PO Take by mouth.   chlorthalidone 25 MG tablet Commonly known as:  HYGROTON Take 25 mg by mouth daily.   [facility] NASAL SPRAY NA Place 1-2 Squirts into the nose daily as needed (congestion). AFRIN   DULoxetine 60 MG capsule Commonly known as:  CYMBALTA Take 60 mg by mouth daily.   gabapentin 300 MG capsule Commonly known as:  NEURONTIN Take 300 mg by mouth 3 (three)  times daily.   irbesartan 150 MG tablet Commonly known as:  AVAPRO Take 150 mg by mouth daily.   labetalol 200 MG tablet Commonly known as:  NORMODYNE Take 400 mg by mouth 2 (two) times daily.   oxyCODONE-acetaminophen 5-325 MG tablet Commonly known as:  PERCOCET/ROXICET Take 1-2 tablets by mouth every 4 (four) hours as needed for moderate pain.   rOPINIRole 1 MG tablet Commonly known as:  REQUIP Take 1 mg by mouth at bedtime.   tiZANidine 2 MG tablet Commonly known as:  ZANAFLEX Take 1 tablet (2 mg total) by mouth every 6 (six) hours as needed for muscle spasms.   traZODone 100 MG tablet Commonly known as:  DESYREL Take 1 tablet (100 mg total) by mouth at bedtime.   Vitamin D3 2000 units Tabs Take 2,000 Units by mouth daily.            Discharge Care Instructions        Start     Ordered   06/07/17 0000  oxyCODONE-acetaminophen (PERCOCET/ROXICET) 5-325 MG tablet  Every 4 hours PRN     06/07/17 2127   06/07/17 0000  tiZANidine (ZANAFLEX) 2 MG tablet  Every 6 hours PRN     06/07/17 2127  06/07/17 0000  Call MD / Call 911    Comments:  If you experience chest pain or shortness of breath, CALL 911 and be transported to the hospital emergency room.  If you develope a fever above 101 F, pus (white drainage) or increased drainage or redness at the wound, or calf pain, call your surgeon's office.   06/07/17 2127   06/07/17 0000  Diet - low sodium heart healthy     06/07/17 2127   06/07/17 0000  Constipation Prevention    Comments:  Drink plenty of fluids.  Prune juice may be helpful.  You may use a stool softener, such as Colace (over the counter) 100 mg twice a day.  Use MiraLax (over the counter) for constipation as needed.   06/07/17 2127   06/07/17 0000  Increase activity slowly as tolerated     06/07/17 2127   06/07/17 0000  Discharge instructions    Comments:  For the first three days, remove your dressing, and tape a piece of saran wrap over your incision. Take  your shower, then remove the saran wrap and put a clean dressing on. After three days you can shower without the saran wrap.  No lifting or bending No driving while taking pain medications.  Call Dr. Gladstone Lighter if any wound complications or temperature of 101 degrees F or over.  Call the office for an appointment to see Dr. Gladstone Lighter in two weeks: [phone] and ask for Dr. Charlestine Night nurse, Brunilda Payor.   06/07/17 2127     Follow-up Information    Latanya Maudlin, MD. Schedule an appointment as soon as possible for a visit in 2 week(s).   Specialty:  Orthopedic Surgery Contact information: [address] [place] [address] [phone]           Signed: [name], PA-C Orthopaedic Surgery 06/13/2017, 1:10 PM

## 2017-10-18 IMAGING — CR DG CHEST 2V
2 series · 2 of 2 positions shown · non-contrast
Comparison: None.

CLINICAL DATA: Preop lumbar surgery.  Hypertension.

EXAM:
CHEST  2 VIEW

[w chest pa]
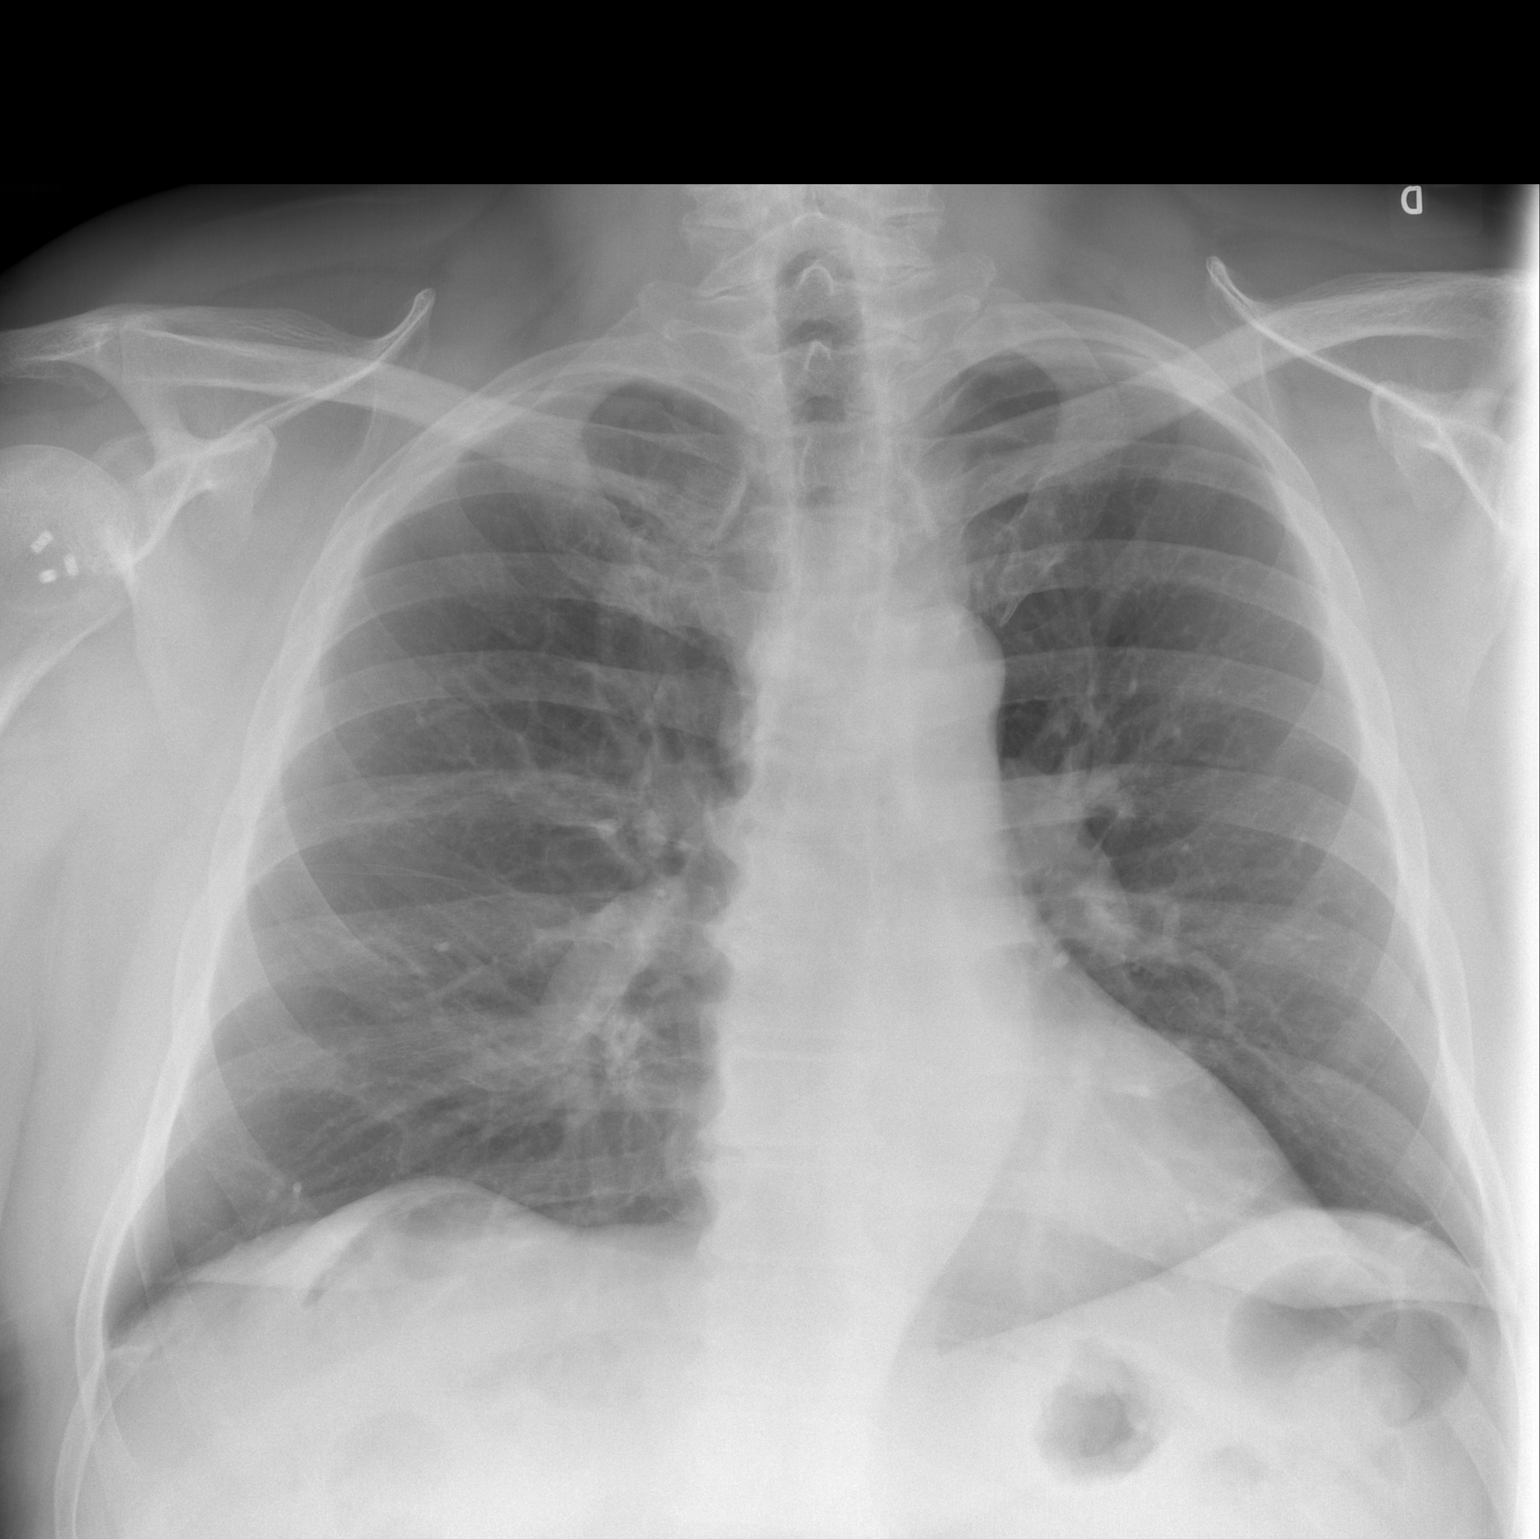

[w chest lat]
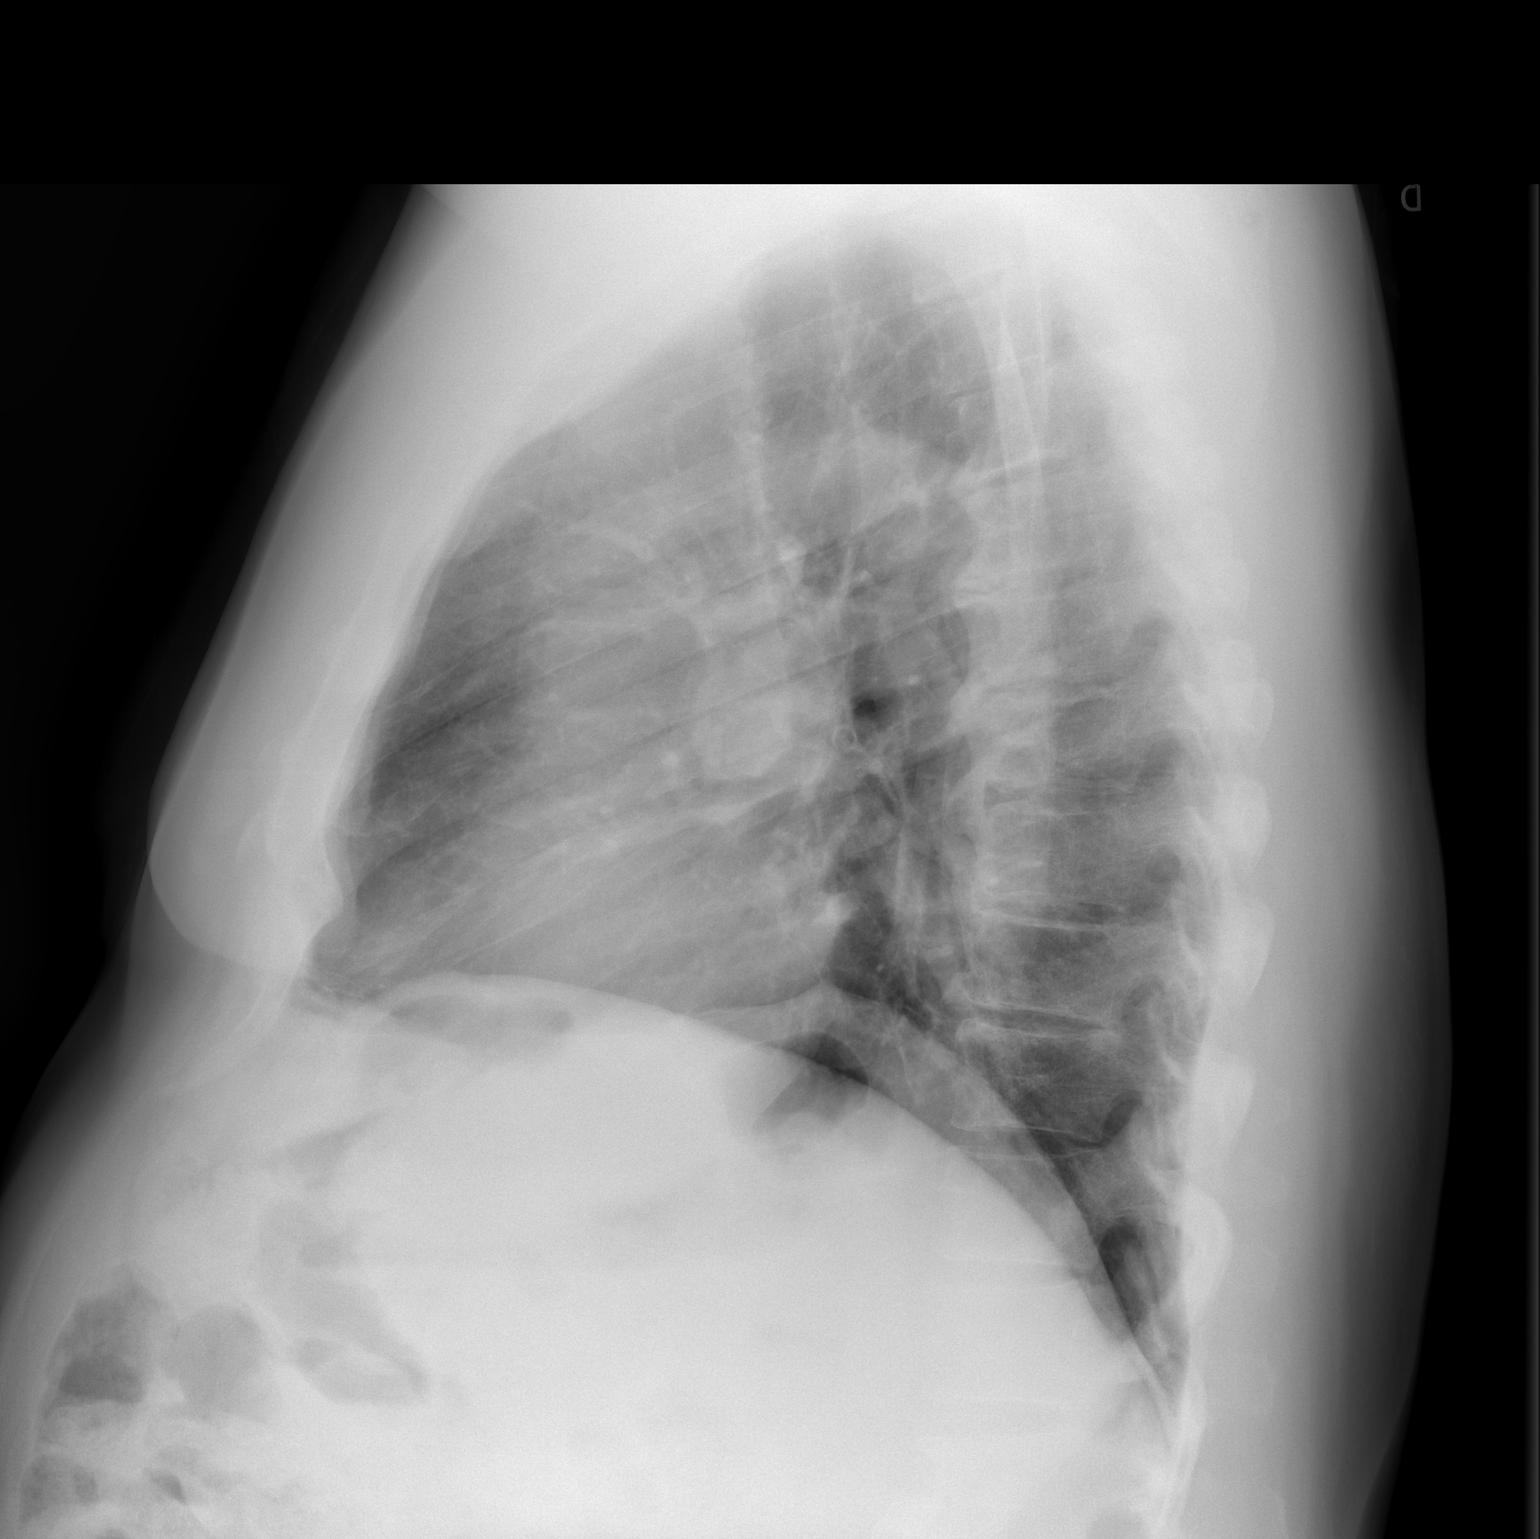

[2 of 2 positions shown; findings below may reference images not displayed]

FINDINGS: Heart and mediastinal contours are within normal limits. No focal
opacities or effusions. No acute bony abnormality.
IMPRESSION: No active cardiopulmonary disease.

## 2017-10-23 IMAGING — DX DG SPINE 1V PORT
1 series · 1 of 1 positions shown · non-contrast
Comparison: Radiographs June 02, 2017.

CLINICAL DATA: L3-4 decompression.

EXAM:
PORTABLE SPINE - 1 VIEW

[l-spine lat]
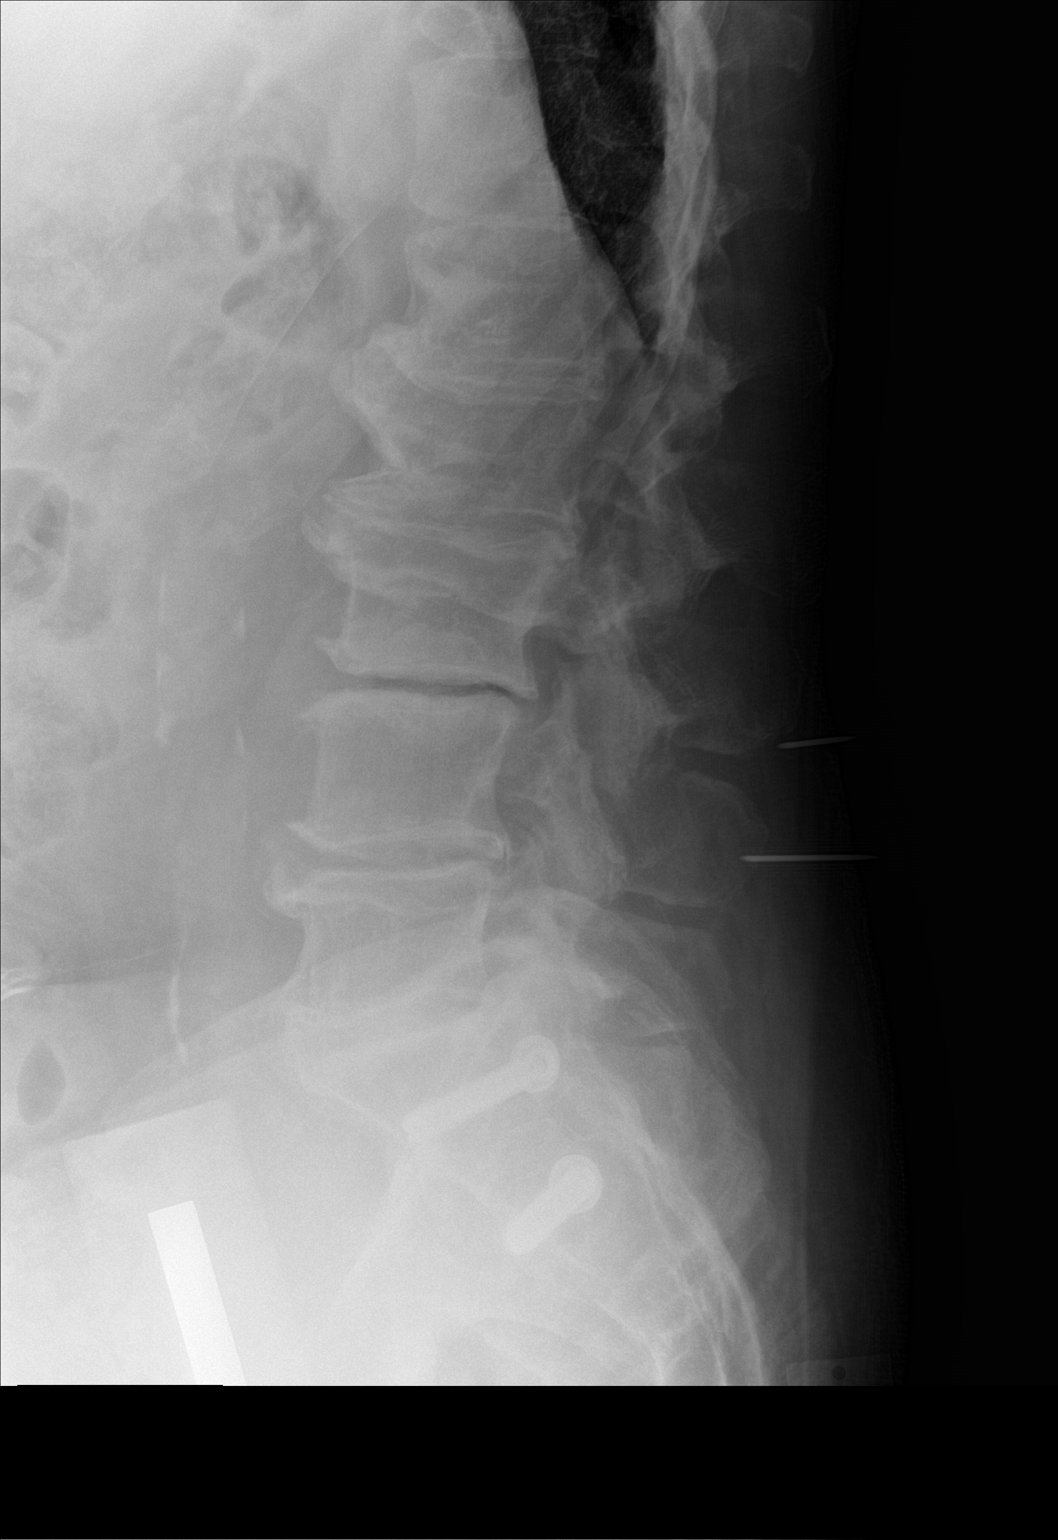

[1 of 1 positions shown; findings below may reference images not displayed]

FINDINGS: Single intraoperative cross-table lateral projection of the lumbar
spine was obtained. These demonstrate surgical probes directed
toward the posterior spinous processes of L3 and L4.
IMPRESSION: Surgical localization as described above.

## 2018-06-03 DIAGNOSIS — E876 Hypokalemia: Secondary | ICD-10-CM | POA: Insufficient documentation

## 2018-07-17 DIAGNOSIS — M25569 Pain in unspecified knee: Secondary | ICD-10-CM | POA: Insufficient documentation

## 2018-11-14 DIAGNOSIS — M170 Bilateral primary osteoarthritis of knee: Secondary | ICD-10-CM | POA: Insufficient documentation

## 2018-11-14 DIAGNOSIS — M19011 Primary osteoarthritis, right shoulder: Secondary | ICD-10-CM | POA: Insufficient documentation

## 2018-11-14 DIAGNOSIS — M75101 Unspecified rotator cuff tear or rupture of right shoulder, not specified as traumatic: Secondary | ICD-10-CM | POA: Insufficient documentation

## 2018-11-21 DIAGNOSIS — Z0000 Encounter for general adult medical examination without abnormal findings: Secondary | ICD-10-CM | POA: Insufficient documentation

## 2019-03-06 DIAGNOSIS — M79675 Pain in left toe(s): Secondary | ICD-10-CM | POA: Insufficient documentation

## 2019-12-19 DIAGNOSIS — R251 Tremor, unspecified: Secondary | ICD-10-CM | POA: Insufficient documentation

## 2020-01-12 DIAGNOSIS — G250 Essential tremor: Secondary | ICD-10-CM | POA: Insufficient documentation

## 2020-01-27 DIAGNOSIS — R0781 Pleurodynia: Secondary | ICD-10-CM | POA: Insufficient documentation

## 2020-01-27 DIAGNOSIS — R0789 Other chest pain: Secondary | ICD-10-CM | POA: Insufficient documentation

## 2020-12-23 ENCOUNTER — Encounter: Payer: Self-pay | Admitting: Gastroenterology

## 2020-12-31 DIAGNOSIS — L049 Acute lymphadenitis, unspecified: Secondary | ICD-10-CM | POA: Insufficient documentation

## 2021-07-06 DIAGNOSIS — R9431 Abnormal electrocardiogram [ECG] [EKG]: Secondary | ICD-10-CM | POA: Insufficient documentation

## 2021-07-06 DIAGNOSIS — I493 Ventricular premature depolarization: Secondary | ICD-10-CM | POA: Insufficient documentation

## 2021-07-06 DIAGNOSIS — I491 Atrial premature depolarization: Secondary | ICD-10-CM | POA: Insufficient documentation

## 2021-09-28 DIAGNOSIS — R0609 Other forms of dyspnea: Secondary | ICD-10-CM | POA: Insufficient documentation

## 2021-11-16 DIAGNOSIS — K1120 Sialoadenitis, unspecified: Secondary | ICD-10-CM | POA: Insufficient documentation

## 2021-12-07 DIAGNOSIS — K115 Sialolithiasis: Secondary | ICD-10-CM | POA: Insufficient documentation

## 2021-12-07 DIAGNOSIS — M270 Developmental disorders of jaws: Secondary | ICD-10-CM | POA: Insufficient documentation

## 2022-05-30 ENCOUNTER — Ambulatory Visit (AMBULATORY_SURGERY_CENTER): Payer: PRIVATE HEALTH INSURANCE | Admitting: *Deleted

## 2022-05-30 ENCOUNTER — Telehealth: Payer: Self-pay | Admitting: Gastroenterology

## 2022-05-30 VITALS — Ht 73.0 in | Wt 204.0 lb

## 2022-05-30 DIAGNOSIS — Z1211 Encounter for screening for malignant neoplasm of colon: Secondary | ICD-10-CM

## 2022-05-30 MED ORDER — PEG 3350-KCL-NA BICARB-NACL 420 G PO SOLR: 4000.0000 mL | Freq: Once | ORAL | 0 refills | Status: AC

## 2022-05-30 MED ORDER — PEG 3350-KCL-NA BICARB-NACL 420 G PO SOLR: 4000.0000 mL | Freq: Once | ORAL | 0 refills | Status: DC

## 2022-05-30 NOTE — Telephone Encounter (Signed)
Patient called states he told Freda Munro B that he wanted the prep [facility] but it was sent to [facility].

## 2022-05-30 NOTE — Telephone Encounter (Signed)
Patient

## 2022-05-30 NOTE — Telephone Encounter (Signed)
Patient returned your call, was told prep [facility].

## 2022-05-30 NOTE — Progress Notes (Signed)
No egg or soy allergy known to patient  No issues known to pt with past sedation with any surgeries or procedures Patient denies ever being told they had issues or difficulty with intubation  No FH of Malignant Hyperthermia Pt is not on diet pills Pt is not on home 02  Pt is not on blood thinners  Pt denies issues with constipation  No A fib or A flutter Have any cardiac testing pending--NO Pt instructed to use Single[employer] or GoodRx for a price reduction on prep   

## 2022-05-30 NOTE — Telephone Encounter (Signed)
[facility]. LMOM that prescription was sent to this pharmacy

## 2022-05-30 NOTE — Telephone Encounter (Signed)
Patient returned your call, was told the prep medication is [facility]

## 2022-05-31 NOTE — Telephone Encounter (Signed)
That needs to be sent to pre nurse pool

## 2022-06-01 ENCOUNTER — Telehealth: Payer: Self-pay | Admitting: Gastroenterology

## 2022-06-01 DIAGNOSIS — Z86010 Personal history of colonic polyps: Secondary | ICD-10-CM

## 2022-06-01 MED ORDER — PEG 3350-KCL-NA BICARB-NACL 420 G PO SOLR: 4000.0000 mL | Freq: Once | ORAL | 0 refills | Status: AC

## 2022-06-01 NOTE — Telephone Encounter (Signed)
error 

## 2022-06-01 NOTE — Telephone Encounter (Signed)
Attempted to contact patient, left message.  It appeared no prep was originally sent , sre-ent [facility] and confirmed it was transmitted to the pharmacy.,

## 2022-07-19 ENCOUNTER — Encounter: Payer: Self-pay | Admitting: Gastroenterology

## 2022-07-19 ENCOUNTER — Ambulatory Visit (AMBULATORY_SURGERY_CENTER): Payer: Medicare PPO | Admitting: Gastroenterology

## 2022-07-19 VITALS — BP 147/71 | HR 47 | Temp 95.7°F | Resp 8 | Ht 73.0 in | Wt 204.0 lb

## 2022-07-19 DIAGNOSIS — D122 Benign neoplasm of ascending colon: Secondary | ICD-10-CM | POA: Diagnosis not present

## 2022-07-19 DIAGNOSIS — Z1211 Encounter for screening for malignant neoplasm of colon: Secondary | ICD-10-CM

## 2022-07-19 MED ORDER — SODIUM CHLORIDE 0.9 % IV SOLN: 500.0000 mL | Freq: Once | INTRAVENOUS | Status: DC

## 2022-07-19 MED ORDER — HYDROCORTISONE (PERIANAL) 2.5 % EX CREA: 1.0000 | TOPICAL_CREAM | Freq: Two times a day (BID) | CUTANEOUS | 1 refills | Status: DC

## 2022-07-19 NOTE — Progress Notes (Signed)
Report to PACU, RN, vss, BBS= Clear.  

## 2022-07-19 NOTE — Patient Instructions (Signed)
- Patient has a contact number available for emergencies. The signs and symptoms of potential delayed complications were discussed with the patient. Return to normal activities tomorrow. Written discharge instructions were provided to the patient. - Resume previous diet. - Miralax 1 capful (17 grams) in 8 ounces of water PO daily. - Use HC Cream 2.5%: Apply externally twice daily for 10 days then as needed. - Await pathology results. - The findings and recommendations were discussed with the patient's family. Handouts on polyps, diverticulosis and hemorrhoids given.  YOU HAD AN ENDOSCOPIC PROCEDURE TODAY AT [facility]:   Refer to the procedure report that was given to you for any specific questions about what was found during the examination.  If the procedure report does not answer your questions, please call your gastroenterologist to clarify.  If you requested that your care partner not be given the details of your procedure findings, then the procedure report has been included in a sealed envelope for you to review at your convenience later.  YOU SHOULD EXPECT: Some feelings of bloating in the abdomen. Passage of more gas than usual.  Walking can help get rid of the air that was put into your GI tract during the procedure and reduce the bloating. If you had a lower endoscopy (such as a colonoscopy or flexible sigmoidoscopy) you may notice spotting of blood in your stool or on the toilet paper. If you underwent a bowel prep for your procedure, you may not have a normal bowel movement for a few days.  Please Note:  You might notice some irritation and congestion in your nose or some drainage.  This is from the oxygen used during your procedure.  There is no need for concern and it should clear up in a day or so.  SYMPTOMS TO REPORT IMMEDIATELY:  Following lower endoscopy (colonoscopy or flexible sigmoidoscopy):  Excessive amounts of blood in the stool  Significant tenderness  or worsening of abdominal pains  Swelling of the abdomen that is new, acute  Fever of 100F or higher  For urgent or emergent issues, a gastroenterologist can be reached at any hour by calling [phone]. Do not use MyChart messaging for urgent concerns.    DIET:  We do recommend a small meal at first, but then you may proceed to your regular diet.  Drink plenty of fluids but you should avoid alcoholic beverages for 24 hours.  ACTIVITY:  You should plan to take it easy for the rest of today and you should NOT DRIVE or use heavy machinery until tomorrow (because of the sedation medicines used during the test).    FOLLOW UP: Our staff will call the number listed on your records the next business day following your procedure.  We will call around 7:15- 8:00 am to check on you and address any questions or concerns that you may have regarding the information given to you following your procedure. If we do not reach you, we will leave a message.     If any biopsies were taken you will be contacted by phone or by letter within the next 1-3 weeks.  Please call us at [phone] if you have not heard about the biopsies in 3 weeks.    SIGNATURES/CONFIDENTIALITY: You and/or your care partner have signed paperwork which will be entered into your electronic medical record.  These signatures attest to the fact that that the information above on your After Visit Summary has been reviewed and is understood.  Full responsibility of the confidentiality of this discharge information lies with you and/or your care-partner.  

## 2022-07-19 NOTE — Progress Notes (Signed)
Called to room to assist during endoscopic procedure.  Patient ID and intended procedure confirmed with present staff. Received instructions for my participation in the procedure from the performing physician.  
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
## 2022-07-19 NOTE — Op Note (Addendum)
[facility] Patient Name: Kristopher [name] Procedure Date: 07/19/2022 8:29 AM MRN: [id number] Endoscopist: Jackquline Denmark , MD Age: 71 Referring MD:  Date of Birth: 09-27-1951 Gender: Male Account #: [id number] Procedure:                Colonoscopy Indications:              Screening for colorectal malignant neoplasm Medicines:                Monitored Anesthesia Care Procedure:                Pre-Anesthesia Assessment:                           - Prior to the procedure, a History and Physical                            was performed, and patient medications and                            allergies were reviewed. The patient's tolerance of                            previous anesthesia was also reviewed. The risks                            and benefits of the procedure and the sedation                            options and risks were discussed with the patient.                            All questions were answered, and informed consent                            was obtained. Prior Anticoagulants: The patient has                            taken no previous anticoagulant or antiplatelet                            agents. ASA Grade Assessment: III - A patient with                            severe systemic disease. After reviewing the risks                            and benefits, the patient was deemed in                            satisfactory condition to undergo the procedure.                           After obtaining informed consent, the colonoscope  was passed under direct vision. Throughout the                            procedure, the patient's blood pressure, pulse, and                            oxygen saturations were monitored continuously. The                            Colonoscope was introduced through the anus and                            advanced to the the cecum, identified by                            appendiceal orifice and  ileocecal valve. The                            colonoscopy was performed with moderate difficulty                            due to a tortuous colon. Successful completion of                            the procedure was aided by applying abdominal                            pressure. The patient tolerated the procedure well.                            The quality of the bowel preparation was good                            except in the right colon where there was adherent                            stool despite 2-day prep. Aggressive suctioning and                            aspiration was performed. Overall over 90 to 95% of                            the colonic mucosa was visualized satisfactorily.                            Overall the examination was adequate. The ileocecal                            valve, appendiceal orifice, and rectum were                            photographed. Scope In: 8:39:09 AM Scope Out: 8:59:59 AM Scope Withdrawal Time: 0 hours 11 minutes 59 seconds  Total  Procedure Duration: 0 hours 20 minutes 50 seconds  Findings:                 A 2 mm polyp was found in the distal ascending                            colon. The polyp was sessile. The polyp was removed                            with a cold snare. Resection and retrieval were                            complete.                           Multiple medium-mouthed diverticula were found in                            the sigmoid colon.                           Non-bleeding internal hemorrhoids were found during                            retroflexion. The hemorrhoids were moderate and                            Grade I with red wale markings suggestive of recent                            bleeding. No active bleeding.                           The exam was otherwise without abnormality on                            direct and retroflexion views. The colon was highly                             redundant. Complications:            No immediate complications. Estimated Blood Loss:     Estimated blood loss: none. Impression:               - One 2 mm polyp in the distal ascending colon,                            removed with a cold snare. Resected and retrieved.                           - Moderate predominantly sigmoid diverticulosis.                           - Non-bleeding internal hemorrhoids.                           -  The examination was otherwise normal on direct                            and retroflexion views. The colon was highly                            redundant. Recommendation:           - Patient has a contact number available for                            emergencies. The signs and symptoms of potential                            delayed complications were discussed with the                            patient. Return to normal activities tomorrow.                            Written discharge instructions were provided to the                            patient.                           - Resume previous diet.                           - Miralax 1 capful (17 grams) in 8 ounces of water                            PO daily.                           - Use HC Cream 2.5%: Apply externally BID for 10                            days then PRN.                           - Await pathology results.                           - The findings and recommendations were discussed                            with the patient's family. Jackquline Denmark, MD 07/19/2022 9:08:03 AM This report has been signed electronically.

## 2022-07-19 NOTE — Progress Notes (Signed)
Vitals-DT  Pt's states no medical or surgical changes since previsit or office visit.  

## 2022-07-20 ENCOUNTER — Telehealth: Payer: Self-pay

## 2022-07-20 NOTE — Telephone Encounter (Signed)
No voicemail box set up.

## 2022-07-21 ENCOUNTER — Telehealth: Payer: Self-pay | Admitting: Gastroenterology

## 2022-07-21 DIAGNOSIS — Z1211 Encounter for screening for malignant neoplasm of colon: Secondary | ICD-10-CM

## 2022-07-21 MED ORDER — HYDROCORTISONE (PERIANAL) 2.5 % EX CREA: TOPICAL_CREAM | CUTANEOUS | 1 refills | Status: AC

## 2022-07-21 NOTE — Telephone Encounter (Signed)
Returned the call to patient's wife.  She stated the pharmacy had not received the prescription ordered on 10/10.  I called the pharmacy and resent the order.  The patient seems to [facility] and the order is under Kristopher [name]. [place].  This information was relayed to [facility] and she verbalized understanding and is aware there is a script waiting for pickup.

## 2022-07-21 NOTE — Telephone Encounter (Signed)
Patient's wife called states [facility] not [facility] for the Hydrocortisone cream please resend.

## 2022-07-24 ENCOUNTER — Encounter: Payer: Self-pay | Admitting: Gastroenterology

## 2023-03-30 ENCOUNTER — Telehealth: Payer: Self-pay | Admitting: Gastroenterology

## 2023-03-30 NOTE — Telephone Encounter (Signed)
Inbound call from [facility] at Dr Norlene Duel office requesting pathology report from previous procedure in 2023 be faxed over to Dr Norlene Duel attention at [phone].  Please advise. Thank you

## 2023-03-30 NOTE — Telephone Encounter (Signed)
07/2022 colonoscopy and pathology report printed and faxed to Dr. Sol Passer as requested.
